# Patient Record
Sex: Female | Born: 1975 | Hispanic: Yes | Marital: Married | Smoking: Never smoker
Health system: Southern US, Community
[De-identification: ages and names within clinical notes are randomized; demographics above are authoritative.]

## PROBLEM LIST (undated history)

## (undated) ENCOUNTER — Inpatient Hospital Stay (HOSPITAL_COMMUNITY): Payer: Self-pay

## (undated) DIAGNOSIS — G43909 Migraine, unspecified, not intractable, without status migrainosus: Secondary | ICD-10-CM

## (undated) DIAGNOSIS — I10 Essential (primary) hypertension: Secondary | ICD-10-CM

## (undated) DIAGNOSIS — O10919 Unspecified pre-existing hypertension complicating pregnancy, unspecified trimester: Secondary | ICD-10-CM

## (undated) DIAGNOSIS — D649 Anemia, unspecified: Secondary | ICD-10-CM

## (undated) DIAGNOSIS — IMO0001 Reserved for inherently not codable concepts without codable children: Secondary | ICD-10-CM

## (undated) HISTORY — DX: Reserved for inherently not codable concepts without codable children: IMO0001

## (undated) HISTORY — DX: Migraine, unspecified, not intractable, without status migrainosus: G43.909

---

## 2010-09-21 LAB — STREP B DNA PROBE: GBS: NEGATIVE

## 2011-04-02 LAB — ANTIBODY SCREEN: Antibody Screen: NEGATIVE

## 2011-04-02 LAB — RPR: RPR: NONREACTIVE

## 2011-04-16 LAB — ABO/RH: RH Type: POSITIVE

## 2011-04-16 LAB — GC/CHLAMYDIA PROBE AMP, GENITAL: Gonorrhea: NEGATIVE

## 2011-08-29 ENCOUNTER — Inpatient Hospital Stay (HOSPITAL_COMMUNITY): Payer: Managed Care, Other (non HMO)

## 2011-08-29 ENCOUNTER — Encounter (HOSPITAL_COMMUNITY): Payer: Self-pay | Admitting: *Deleted

## 2011-08-29 ENCOUNTER — Inpatient Hospital Stay (HOSPITAL_COMMUNITY)
Admission: AD | Admit: 2011-08-29 | Discharge: 2011-08-29 | Disposition: A | Discharge status: Home / Self Care | Payer: Managed Care, Other (non HMO) | Source: Ambulatory Visit | Attending: Obstetrics & Gynecology | Admitting: Obstetrics & Gynecology

## 2011-08-29 DIAGNOSIS — O99891 Other specified diseases and conditions complicating pregnancy: Secondary | ICD-10-CM | POA: Insufficient documentation

## 2011-08-29 DIAGNOSIS — R030 Elevated blood-pressure reading, without diagnosis of hypertension: Secondary | ICD-10-CM | POA: Insufficient documentation

## 2011-08-29 HISTORY — DX: Anemia, unspecified: D64.9

## 2011-08-29 LAB — COMPREHENSIVE METABOLIC PANEL
ALT: 9 U/L (ref 0–35)
BUN: 6 mg/dL (ref 6–23)
CO2: 24 mEq/L (ref 19–32)
Calcium: 9.1 mg/dL (ref 8.4–10.5)
GFR calc Af Amer: >90 mL/min (ref >90)
GFR calc non Af Amer: >90 mL/min (ref >90)
Glucose, Bld: 90 mg/dL (ref 70–99)
Sodium: 133 mEq/L — ABNORMAL LOW (ref 135–145)

## 2011-08-29 LAB — CBC
HCT: 38.3 % (ref 36.0–46.0)
Hemoglobin: 12.6 g/dL (ref 12.0–15.0)
MCH: 27.9 pg (ref 26.0–34.0)
MCHC: 32.9 g/dL (ref 30.0–36.0)
MCV: 84.7 fL (ref 78.0–100.0)
RBC: 4.52 MIL/uL (ref 3.87–5.11)

## 2011-08-29 LAB — URIC ACID: Uric Acid, Serum: 3.3 mg/dL (ref 2.4–7.0)

## 2011-08-29 LAB — URINALYSIS, DIPSTICK ONLY
Glucose, UA: NEGATIVE mg/dL
Hgb urine dipstick: NEGATIVE
Nitrite: NEGATIVE

## 2011-08-29 LAB — PROTEIN / CREATININE RATIO, URINE: Total Protein, Urine: 5.1 mg/dL

## 2011-08-29 MED ORDER — LABETALOL HCL 100 MG PO TABS: 100.0000 mg | ORAL_TABLET | Freq: Three times a day (TID) | ORAL | Status: DC

## 2011-08-29 MED ORDER — LABETALOL HCL 100 MG PO TABS
100.0000 mg | ORAL_TABLET | Freq: Three times a day (TID) | ORAL | Status: DC
  Administered 2011-08-29: 100 mg via ORAL
  Filled 2011-08-29: qty 1

## 2011-08-29 MED ORDER — LABETALOL HCL 5 MG/ML IV SOLN
10.0000 mg | Freq: Once | INTRAVENOUS | Status: AC
  Administered 2011-08-29: 10 mg via INTRAVENOUS
  Filled 2011-08-29: qty 4

## 2011-08-29 MED ORDER — LACTATED RINGERS IV SOLN
INTRAVENOUS | Status: DC
  Administered 2011-08-29: 15 mL/h via INTRAVENOUS

## 2011-08-29 NOTE — H&P (Signed)
CC: htn  HPI: 36 yo G3P1102 at 29'5 being sent from the office for serial bps, PEC eval and fetal monitoring. Pt seen in office w/ no symptoms or physical signs of PEC other than elevated bp. Pt notes no HA, no vision change, no scotomata, no RUQ pain, tol reg po, good fetal movement, occ ctx, no abdominal pain, no bleeding, no LOF, good fetal movement. Pt with no prior h/o chronic htn but does have a h/o PPROM/ PEC at 28 wks as a G1.  PN Issues: - AMA, nl screening - Planning TL - h/o early PEC/ PPROM. Subsequent term delivery as G2 w/ no 17-P, no complications  Meds: PNV All: NKDA FH: mother and father w/ htn SH: primarily spanish speaking, understands limited [place].  PN Labs: DS 131, AFP neg, Hep B neg, RI, A+, HIV NR  PE:  Filed Vitals:   08/29/11 1719 08/29/11 1732 08/29/11 1748 08/29/11 1749  BP: 139/89 133/85 128/75   Pulse: 79 78 76   Temp: 98.1 F (36.7 C)     TempSrc: Oral   Oral  Resp: 20     Height:      Weight:      SpO2:        Gen: well, no distress, no pain CV: RRR Pulm: CTAB Abd: soft, gravid, NT, no RUQ pain Back: no CVAT LE: tr edema, NT, no clonus, 2+ DTR GU: def Toco: occ irritibility FH: 120's, + accels, no decels, 10 beat var  U/s: transverse, nl placenta, AFI 16, nl cvx length, growth 3'7 (65%), 4 fibroids, largest 6x5x3  CBC    Component Value Date/Time   WBC 6.9 08/29/2011 1240   RBC 4.52 08/29/2011 1240   HGB 12.6 08/29/2011 1240   HCT 38.3 08/29/2011 1240   PLT 308 08/29/2011 1240   MCV 84.7 08/29/2011 1240   MCH 27.9 08/29/2011 1240   MCHC 32.9 08/29/2011 1240   RDW 13.5 08/29/2011 1240   CMP     Component Value Date/Time   NA 133* 08/29/2011 1240   K 3.5 08/29/2011 1240   CL 100 08/29/2011 1240   CO2 24 08/29/2011 1240   GLUCOSE 90 08/29/2011 1240   BUN 6 08/29/2011 1240   CREATININE 0.49* 08/29/2011 1240   CALCIUM 9.1 08/29/2011 1240   PROT 7.2 08/29/2011 1240   ALBUMIN 2.8* 08/29/2011 1240   AST 17 08/29/2011 1240   ALT 9 08/29/2011  1240   ALKPHOS 211* 08/29/2011 1240   BILITOT 0.2* 08/29/2011 1240   GFRNONAA >90 08/29/2011 1240   GFRAA >90 08/29/2011 1240    UA 3.3 UA: neg protein  Hosp course: pt observed in MAU, elevated bp's, still w/o symptoms, intially given 100mg  po labetolol, no improvement in 1 hr, pt given 10mg  IV labetolol and improvement noted, pt cont to be observed and bp's stable.  A/P: 36 yo G3P1102 at 29'5 w/ elevated bp's and h/o early PEC in prior preg now w/ elevated bps but w/o lab or exam findings c/w PEC. Labs stable, fetal testing reassuring, no change to exam. Pt given option of o/n observation but instead asks to go home, will check bp tomorrow and report elevated bps to our after hour service. Pt also aware that any new HA, decreased FM, vaginal bleeding, ROM, increased ctx, scotomata, blurry vision, RUQ pain, N/V will need repeat eval. Will start on 100mg  po labetalol tid and f/u in office on Mon. Pt agrees.  - Reactive fetal testing  Avry Monteleone A. 08/29/2011 6:24  PM

## 2011-08-29 NOTE — Progress Notes (Signed)
Feel good, little bit of headache. Sent from office for eval.

## 2011-09-05 ENCOUNTER — Inpatient Hospital Stay (HOSPITAL_COMMUNITY)
Admission: AD | Admit: 2011-09-05 | Discharge: 2011-09-06 | Disposition: A | Discharge status: Home / Self Care | Payer: Managed Care, Other (non HMO) | Source: Ambulatory Visit | Attending: Obstetrics and Gynecology | Admitting: Obstetrics and Gynecology

## 2011-09-05 ENCOUNTER — Encounter (HOSPITAL_COMMUNITY): Payer: Self-pay | Admitting: *Deleted

## 2011-09-05 DIAGNOSIS — O4700 False labor before 37 completed weeks of gestation, unspecified trimester: Secondary | ICD-10-CM | POA: Insufficient documentation

## 2011-09-05 DIAGNOSIS — O10019 Pre-existing essential hypertension complicating pregnancy, unspecified trimester: Secondary | ICD-10-CM | POA: Insufficient documentation

## 2011-09-05 HISTORY — DX: Essential (primary) hypertension: I10

## 2011-09-05 LAB — CBC
Hemoglobin: 11.5 g/dL — ABNORMAL LOW (ref 12.0–15.0)
MCH: 28.2 pg (ref 26.0–34.0)
Platelets: 278 10*3/uL (ref 150–400)
RBC: 4.08 MIL/uL (ref 3.87–5.11)
WBC: 8.2 10*3/uL (ref 4.0–10.5)

## 2011-09-05 NOTE — Progress Notes (Signed)
G3P2 at 30.5wks. Ctxs all day. Earlier about 1 ctx every 3 hrs. But now a contraction about every . Has a h/a and states B/P has been elevated some last few wks.

## 2011-09-05 NOTE — ED Notes (Signed)
Dr cousins paged

## 2011-09-05 NOTE — Progress Notes (Signed)
Dr Cherly Hensen called unit. Aware of pt's admission and status. Aware of ctx pattern, elevated B/Ps, hx of preeclampsia, efm strip. Orders received and will see pt.

## 2011-09-05 NOTE — ED Notes (Signed)
FFN collected and pt tol well.  

## 2011-09-06 LAB — COMPREHENSIVE METABOLIC PANEL
ALT: 10 U/L (ref 0–35)
AST: 18 U/L (ref 0–37)
Alkaline Phosphatase: 208 U/L — ABNORMAL HIGH (ref 39–117)
CO2: 25 mEq/L (ref 19–32)
Chloride: 101 mEq/L (ref 96–112)
GFR calc Af Amer: >90 mL/min (ref >90)
GFR calc non Af Amer: >90 mL/min (ref >90)
Glucose, Bld: 98 mg/dL (ref 70–99)
Potassium: 3.6 mEq/L (ref 3.5–5.1)
Sodium: 133 mEq/L — ABNORMAL LOW (ref 135–145)
Total Bilirubin: 0.1 mg/dL — ABNORMAL LOW (ref 0.3–1.2)

## 2011-09-06 NOTE — Progress Notes (Signed)
Written and verbal d/c instructions given and understanding voiced. 

## 2011-09-06 NOTE — Discharge Instructions (Signed)
Sport and exercise psychologist  (Preterm Labor) El parto prematuro comienza antes de la semana 37 de [place]. La duracin de un embarazo normal es de 39 a 41 semanas.  CAUSAS  Generalmente no hay una causa que pueda identificarse. Sin embargo, una de las causas conocidas ms frecuentes son las infecciones. Las infecciones del tero, el cuello, la vagina, el lquido Golf, la vejiga, los riones y Teacher, adult education de los pulmones (neumona) pueden hacer que el trabajo de parto se inicie. Otras causas son:   Infecciones urogenitales, como infecciones por hongos y vaginosis bacteriana.   Anormalidades uterinas (forma del tero, sptum uterino, fibromas, hemorragias en la placenta).   Un cuello que ha sido operado y se abre prematuramente.   Malformaciones del beb.   Gestaciones mltiples (mellizos, trillizos y ms).   Ruptura del saco amnitico.  :Otros factores de riesgo del parto prematuro son   Historia previa de Sport and exercise psychologist.   Ruptura prematura de las [place].   La placenta cubre la apertura del cuello (placenta previa).   La placenta se separa del tero (abrupcin placentaria).   El cuello es demasiado dbil para contener al beb en el tero (cuello incompetente).   Hay mucho lquido en el saco amnitico (polihidramnios).   Consumo de drogas o hbito de fumar durante Firefighter.   No aumentar de peso lo suficiente durante el [employer].   Mujeres menores de 18 aos o [address] de [address] [place].   Nivel socioeconmico bajo.   Raza afroamericana.  SNTOMAS  Los signos y sntomas son:   Clicos del tipo menstrual   Contracciones con un intervalo entre 30 y 70 segundos, comienzan a ser regulares, se hacen ms frecuentes y se hacen ms intensas y dolorosas.   Contracciones que comienzan en la parte superior del tero y se expanden hacia abajo, hacia la zona inferior del abdomen y la espalda.   Sensacin de presin en la pelvis o dolor en la espalda.   Aparece una secrecin acuosa o  sanguinolenta por la vagina.  DIAGNSTICO  El diagnstico puede confirmarse:   Con un examen vaginal.   Ecografa del cuello.   Muestra (hisopado) de las secreciones crvico-vaginales. Estas muestras se analizan para buscar la presencia de fibronectina fetal. Esta protena que se encuentra en las secreciones del tero y se asocia con el parto prematuro.   Monitoreo fetal  TRATAMIENTO  Segn el tiempo del Psychiatrist y otras [place], el mdico puede indicar reposo en cama. Si es necesario, le indicarn medicamentos para TEFL teacher las contracciones y apurar la maduracin de los pulmones del feto. Si el trabajo de parto se inicia antes de las 34 semanas de [place], se recomienda la hospitalizacin. El tratamiento depende de las condiciones en que se encuentre la madre y el beb.  PREVENCIN  Hay algunas cosas que [employer] puede hacer para disminuir el riesgo de trabajo de parto prematuro en futuros [employer]. Una mam puede:   Dejar de fumar.   Mantener un peso saludable y evitar sustancias qumicas y drogas innecesarias.   Controlar todo tipo de infeccin.   Informar al mdico si tiene una historia conocida de parto prematuro.  Document Released: 10/07/2007 Document Revised: 03/12/2011 [facility] Patient Information 2012 [place], [place].Preeclampsia y eclampsia (Preeclampsia and Eclampsia) La preeclampsia es un trastorno por el cual hay un aumento de la presin arterial durante el embarazo. Ocurre despus de la 20a. semana de gestacin. Si se produce durante la segunda mitad del [place], y no hay otros sntomas, se denomina hipertensin gestacional y  desaparece luego que el beb nace. Si junto a la hipertensin gestacional se desarrolla alguno de los sntomas que se enumeran ms abajo, el trastorno se denomina preeclampsia. La eclampsia (convulsiones) puede seguir a la preeclampsia. sta es una de las razones por las que deben realizarse controles prenatales. Es muy importante realizar un  diagnstico y un tratamiento precoz para la prevencin. CAUSAS No existe una causa conocida para este problema. Existen algunos problemas que pueden poner a la mujer embarazada en riesgo de sufrirlo. Ellos son:  Engineer, agricultural.   Haber sufrido preeclampsia en embarazos anteriores.   Sufrir hipertensin crnica.   Si se trata de un embarazo mltiple (mellizos, trillizos).   Tener 35 aos o ms.   Pertenecer a la Public affairs consultant.   Sufrir problemas renales o ser diabtica.   Sufrir enfermedades como lupus o problemas sanguneos.   Tener sobrepeso (ser Maryjane Hurter).  SNTOMAS  Presin arterial elevada.   Dolor de cabeza   Aumento de peso sbito.   Hinchazn de manos, rostro, piernas y pies   Protenas en la orina.   Nuseas y vmitos   Problemas visuales (visin doble o borrosa).   Adormecimiento del rostro, brazos, piernas y pies.   Mareos.   Habla arrastrando las palabras.   La preeclampsia puede causar un retraso en la maduracin del feto.   Separacin (abrupcin) de la placenta.   No hay suficiente lquido en el saco amnitico (oligohidramnios).   Sensibilidad a la luz brillante.   Dolor abdominal.  DIAGNSTICO Si se descubren protenas en la orina durante la segunda mitad del [place], esto se considera preeclampsia. Tambin puede haber otros de los sntomas ya mencionados. TRATAMIENTO Es necesario Pensions consultant.   El profesional que la asiste podr prescribirle reposo en cama en las primeras etapas de la enfermedad. Mucho reposo y restriccin de sal puede ser todo lo que necesite.   Si no responde a los tratamientos ms conservadores, podr ser necesaria la administracin de medicamentos.   En los casos graves, podr ser necesaria la hospitalizacin.   Para tratar la hipertensin.   Para controlar la retencin de lquidos.   Para verificar que el beb no sufra ningn dao.   La hospitalizacin es el mejor modo de tratar los primeros  signos de preeclampsia. [employer], la mam y el beb pueden ser observados cuidadosamente y los anlisis de sangre se realizarn de manera ms efectiva y precisa.   Si el trastorno se hace ms grave, podr ser necesario inducir el parto o practicar una cesrea (extraccin del beb por medios quirrgicos). El mejor tratamiento para la preeclampsia/eclampsia es el [place].  La preeclampsia y la eclampsia implican riesgos para la madre y el beb. El profesional lo comentar con usted.. Juntos pueden elaborar la mejor manera de abordar sus problemas.  INSTRUCCIONES PARA EL CUIDADO DOMICILIARIO  Cumpla con las citas y los anlisis prenatales tal como se le indic.   Consulte con el mdico si tiene alguno de [employer].   Descanse y duerma lo suficiente.   Consuma una dieta balanceada, baja en sal, y no agregue sal a la comida.   Evite las situaciones estresantes.   Slo tome medicamentos de venta libre o de prescripcin para Chief Technology Officer, Environmental health practitioner o la fiebre, segn le haya indicado el mdico.  SOLICITE ATENCIN MDICA DE INMEDIATO SI:  Observa que se hincha alguna zona del cuerpo, generalmente las piernas.   Aumenta 5 libras (2,3 Kg) o ms en una semana.  Presenta una cefalea intensa, mareos, problemas visuales o confusin.   Tiene dolor en el abdomen, nuseas o vmitos.   Sufre convulsiones.   Tiene dificultad para mover cualquier parte del cuerpo, siente adormecimiento o tiene dificultad para hablar.   Observa un hematoma o una hemorragia anormal.   Aparece rigidez en el cuello.   Vomita.  ASEGRESE QUE:  Comprende estas instrucciones.   Controlar su enfermedad.   Solicitar ayuda inmediatamente si no mejora o si empeora.  Document Released: 04/09/2005 Document Revised: 03/12/2011 [facility] Patient Information 2012 [place], [place].

## 2011-09-06 NOTE — ED Notes (Signed)
Dr. Cousins in to see pt.

## 2011-09-06 NOTE — ED Notes (Signed)
Lab results called to Dr Cherly Hensen. Pt stable for d/c home.

## 2011-09-06 NOTE — ED Provider Notes (Signed)
History   36 yo G3P1102 Hispanic female now @ 30 4/7 week presents for evaluation of ctxs q 30-45 mins  All day. Per pt ctx have gotten closer. (+) FM  No vaginal bleeding. PNC complicated by elev BP for which pt was started on normadyne. Per pt, has had some visual spots. Per husband h/a all the time  Chief Complaint  Patient presents with  . Contractions     OB History    Grav Para Term Preterm Abortions TAB SAB Ect Mult Living   3 2 2       2       Past Medical History  Diagnosis Date  . Anemia   . Hypertension     Past Surgical History  Procedure Date  . No past surgeries   . Cesarean section     Family History  Problem Relation Age of Onset  . Anesthesia problems Neg Hx   . Hypotension Neg Hx   . Malignant hyperthermia Neg Hx   . Pseudochol deficiency Neg Hx     History  Substance Use Topics  . Smoking status: Never Smoker   . Smokeless tobacco: Never Used  . Alcohol Use: No    Allergies: No Known Allergies  Prescriptions prior to admission  Medication Sig Dispense Refill  . acetaminophen (TYLENOL) 500 MG tablet Take 1,000 mg by mouth every 6 (six) hours as needed. Takes for pain      . labetalol (NORMODYNE) 100 MG tablet Take 100-200 mg by mouth 3 (three) times daily. Take 200 mg in the morning, 200 mg in the evening, and 100 mg at night.      . Prenatal Vit-Fe Fumarate-FA (PRENATAL MULTIVITAMIN) TABS Take 1 tablet by mouth daily.         Physical Exam   Blood pressure 144/96, pulse 70, temperature 98.8 F (37.1 C), temperature source Oral, resp. rate 20, height 5\' 2"  (1.575 m), weight 157 lb 12.8 oz (71.578 kg).  General appearance: alert, cooperative and no distress Lungs: clear to auscultation bilaterally Heart: regular rate and rhythm, S1, S2 normal, no murmur, click, rub or gallop Abdomen: soft, non-tender; bowel sounds normal; no masses,  no organomegaly Pelvic: cervix normal in appearance and long/closed/firm ballottable  Extremities: no  edema, redness or tenderness in the calves or thighs  Tracing reviewed. irreg ctx baseline 140 small accels.  ED Course  PMC Chronic HTN r/o superimposed preeclampsia Hx PTB IUP @ 30 4/7  P) FFN. PIH labs. If nl results, d/c home with PTL prec, preeclampsia warning signs MDM  Anwyn Kriegel A, MD 12:41 AM 09/06/2011

## 2011-09-26 ENCOUNTER — Inpatient Hospital Stay (HOSPITAL_COMMUNITY)
Admission: AD | Admit: 2011-09-26 | Discharge: 2011-09-30 | DRG: 765 | Disposition: A | Discharge status: Home / Self Care | Payer: Managed Care, Other (non HMO) | Source: Ambulatory Visit | Attending: Obstetrics | Admitting: Obstetrics

## 2011-09-26 ENCOUNTER — Inpatient Hospital Stay (HOSPITAL_COMMUNITY): Payer: Managed Care, Other (non HMO)

## 2011-09-26 ENCOUNTER — Encounter (HOSPITAL_COMMUNITY): Payer: Self-pay | Admitting: *Deleted

## 2011-09-26 DIAGNOSIS — O1002 Pre-existing essential hypertension complicating childbirth: Principal | ICD-10-CM | POA: Diagnosis present

## 2011-09-26 DIAGNOSIS — O34599 Maternal care for other abnormalities of gravid uterus, unspecified trimester: Secondary | ICD-10-CM | POA: Diagnosis present

## 2011-09-26 DIAGNOSIS — D4959 Neoplasm of unspecified behavior of other genitourinary organ: Secondary | ICD-10-CM | POA: Diagnosis present

## 2011-09-26 DIAGNOSIS — D259 Leiomyoma of uterus, unspecified: Secondary | ICD-10-CM | POA: Diagnosis present

## 2011-09-26 DIAGNOSIS — O365990 Maternal care for other known or suspected poor fetal growth, unspecified trimester, not applicable or unspecified: Secondary | ICD-10-CM | POA: Diagnosis present

## 2011-09-26 DIAGNOSIS — I10 Essential (primary) hypertension: Secondary | ICD-10-CM | POA: Diagnosis present

## 2011-09-26 HISTORY — DX: Unspecified pre-existing hypertension complicating pregnancy, unspecified trimester: O10.919

## 2011-09-26 LAB — COMPREHENSIVE METABOLIC PANEL
Albumin: 2.8 g/dL — ABNORMAL LOW (ref 3.5–5.2)
Alkaline Phosphatase: 268 U/L — ABNORMAL HIGH (ref 39–117)
BUN: 8 mg/dL (ref 6–23)
CO2: 21 mEq/L (ref 19–32)
Chloride: 101 mEq/L (ref 96–112)
Glucose, Bld: 119 mg/dL — ABNORMAL HIGH (ref 70–99)
Potassium: 3.7 mEq/L (ref 3.5–5.1)
Total Bilirubin: 0.1 mg/dL — ABNORMAL LOW (ref 0.3–1.2)

## 2011-09-26 LAB — TYPE AND SCREEN
ABO/RH(D): A POS
Antibody Screen: NEGATIVE

## 2011-09-26 LAB — ABO/RH: ABO/RH(D): A POS

## 2011-09-26 LAB — URIC ACID: Uric Acid, Serum: 4.3 mg/dL (ref 2.4–7.0)

## 2011-09-26 LAB — RPR: RPR: NONREACTIVE

## 2011-09-26 LAB — CBC
Hemoglobin: 12.3 g/dL (ref 12.0–15.0)
RBC: 4.37 MIL/uL (ref 3.87–5.11)

## 2011-09-26 MED ORDER — LABETALOL HCL 300 MG PO TABS
300.0000 mg | ORAL_TABLET | Freq: Three times a day (TID) | ORAL | Status: DC
  Administered 2011-09-26: 300 mg via ORAL
  Filled 2011-09-26 (×3): qty 1

## 2011-09-26 MED ORDER — ACETAMINOPHEN 325 MG PO TABS: 650.0000 mg | ORAL_TABLET | ORAL | Status: DC | PRN

## 2011-09-26 MED ORDER — CALCIUM CARBONATE ANTACID 500 MG PO CHEW: 2.0000 | CHEWABLE_TABLET | ORAL | Status: DC | PRN

## 2011-09-26 MED ORDER — LABETALOL HCL 5 MG/ML IV SOLN
10.0000 mg | Freq: Once | INTRAVENOUS | Status: AC
  Administered 2011-09-26: 10 mg via INTRAVENOUS
  Filled 2011-09-26: qty 4

## 2011-09-26 MED ORDER — ZOLPIDEM TARTRATE 10 MG PO TABS: 10.0000 mg | ORAL_TABLET | Freq: Every evening | ORAL | Status: DC | PRN

## 2011-09-26 MED ORDER — SODIUM CHLORIDE 0.9 % IJ SOLN: 3.0000 mL | Freq: Two times a day (BID) | INTRAMUSCULAR | Status: DC

## 2011-09-26 MED ORDER — ACETAMINOPHEN 500 MG PO TABS
1000.0000 mg | ORAL_TABLET | ORAL | Status: DC | PRN
  Administered 2011-09-26: 1000 mg via ORAL
  Filled 2011-09-26: qty 2

## 2011-09-26 MED ORDER — DOCUSATE SODIUM 100 MG PO CAPS
100.0000 mg | ORAL_CAPSULE | Freq: Every day | ORAL | Status: DC
  Administered 2011-09-26: 100 mg via ORAL
  Filled 2011-09-26: qty 1

## 2011-09-26 MED ORDER — SODIUM CHLORIDE 0.9 % IJ SOLN: 3.0000 mL | INTRAMUSCULAR | Status: DC | PRN

## 2011-09-26 MED ORDER — BETAMETHASONE SOD PHOS & ACET 6 (3-3) MG/ML IJ SUSP
12.0000 mg | INTRAMUSCULAR | Status: DC
  Administered 2011-09-26: 12 mg via INTRAMUSCULAR
  Filled 2011-09-26: qty 2

## 2011-09-26 MED ORDER — HYDRALAZINE HCL 20 MG/ML IJ SOLN
10.0000 mg | Freq: Once | INTRAMUSCULAR | Status: AC
  Administered 2011-09-26: 10 mg via INTRAVENOUS

## 2011-09-26 MED ORDER — PRENATAL MULTIVITAMIN CH: 1.0000 | ORAL_TABLET | Freq: Every day | ORAL | Status: DC

## 2011-09-26 MED ORDER — HYDRALAZINE HCL 20 MG/ML IJ SOLN
INTRAMUSCULAR | Status: AC
  Administered 2011-09-26: 10 mg via INTRAVENOUS
  Filled 2011-09-26: qty 1

## 2011-09-26 MED ORDER — SODIUM CHLORIDE 0.9 % IV SOLN: 250.0000 mL | INTRAVENOUS | Status: DC | PRN

## 2011-09-26 NOTE — H&P (Signed)
CC: gestational hypertension and reversed end diastolic flow through umbilical artery  HPI: 36 yo Spanish speaking G3P1102 at 33'5 presents from office for further evaluation due to exacerbation of hypertension and finding of reversed EDF on OB u/s today. Pt states occasional HA, usually relieved by Tylenol, no scotomata. No RUQ pain, no complaints of swelling. Normal appetite, no emesis. Pt notes no contractions, no LOF, no VB but does not baby moving less than normal  PN Issues: Dated by LMP c/w 12 wk u/s  - Hypertension.  Pt denies h/o chronic htn but was noted to have mildly elevated bps through the first and second trimester. Acute elevations of bp at 29 wks (160's/ 110) and extended observation in MAU at that time. Pt had improvement in bp's with labetolol but more recently has needed continued dose adjustments. 24 hr urine 2/18 showed 150 mg protein. PIH labs have remained normal (most recently 2/24)  - Fetal testing. Due to htn, fetal growth u/s done 3/15 noted growth 1850g/ 4'1/ 25% with AFI 9.5 (13%). AC 27.3cm (4%). BPP 8/8 but reversed EDF noted.   - desires VBAC. H/o 28 wk urgent c/s in [place] followed by 37 wk VBAC in [place]  - fibroids. Anterior uterine wall 5 x 5 x 5 cm  - desires TL  PMH: 28 wk urgent c/s  FH: chronic htn SH: married, no toxic habits, limited social support, spanish speaker with language barrier  POBHx: - G1, in [place], 28 wks urgent c/s. Pt notes htn/ PEC then PROM then fetal distress. Unable to obtain clear picture of events - G2, uncomplicated VBAC at 37 wks, [place] (no 17-P)  NKDA Meds: PNV, Tylenol, labetalol 300mg  tid  PN labs: A+, ab neg, HIV neg, Hep B neg, RPR NR, RI, nl NT, nl AFP, 1 hr GTT 131  PE Filed Vitals:   09/26/11 2036 09/26/11 2050 09/26/11 2104 09/26/11 2105  BP: 156/64 170/111 171/113 173/106  Pulse: 81 81 73 88  Temp:      TempSrc:      Resp:      Height:      Weight:       Gen: well appearing, no distress CV:  RRR Pulm: CTAB Abd: gravid, NT, no RUQ pain LE: 1+ edema, 3+ DTR, no clonus GU: def Toco: none FH: 140's, + accels, no decels, 10 beat variability  A/P: 36 yo G3P1102 at 33'5 w/ doppler findings of reversed end diastolic flow in the setting of increasing bps. - elevated bps. Unclear if pt has a history of chronic htn (borderline bp's in 1st/2nd trimester) and this is an exacerbation of acute on chronic htn or if this is a manifestation of PEC. Pt has no other lab or clinical findings of PEC.  The UA findings can be c/w either htn related placental insufficiency or PEC. If pt was determined to have PEC would move towards delivery, possibly before the completion of BMZ as pt is so close to 24 wks. At present time, pt is stable and fetal strip is reactive. Would also consider moving toward delivery if unable to control bps. Dr. Sherrie George consulted about pt's care who agrees with either immediate delivery for unstable bps in setting of reversed EDF or close monitoring with delivery upon completion of BMZ.  - Early GA, BMZ started. - FWB. Concern given doppler findings, will repeat now. BPP 8/8 and NST reactive.  - MOD. Pt intially planning TOLAC though would plan RCS if moving for delivery due to abnormal  UA dopplers.   Glynn Yepes A. 09/26/2011 9:21 PM

## 2011-09-27 ENCOUNTER — Encounter (HOSPITAL_COMMUNITY): Payer: Self-pay | Admitting: Anesthesiology

## 2011-09-27 ENCOUNTER — Encounter (HOSPITAL_COMMUNITY): Payer: Self-pay | Admitting: *Deleted

## 2011-09-27 ENCOUNTER — Inpatient Hospital Stay (HOSPITAL_COMMUNITY): Payer: Managed Care, Other (non HMO) | Admitting: Anesthesiology

## 2011-09-27 ENCOUNTER — Encounter (HOSPITAL_COMMUNITY)
Admission: AD | Disposition: A | Discharge status: Home / Self Care | Payer: Self-pay | Source: Ambulatory Visit | Attending: Obstetrics

## 2011-09-27 DIAGNOSIS — O10919 Unspecified pre-existing hypertension complicating pregnancy, unspecified trimester: Secondary | ICD-10-CM

## 2011-09-27 DIAGNOSIS — I10 Essential (primary) hypertension: Secondary | ICD-10-CM | POA: Diagnosis present

## 2011-09-27 HISTORY — DX: Unspecified pre-existing hypertension complicating pregnancy, unspecified trimester: O10.919

## 2011-09-27 LAB — CBC
HCT: 37.1 % (ref 36.0–46.0)
Hemoglobin: 12.3 g/dL (ref 12.0–15.0)
MCH: 28.1 pg (ref 26.0–34.0)
MCHC: 33.4 g/dL (ref 30.0–36.0)
MCV: 84.2 fL (ref 78.0–100.0)
RBC: 4.37 MIL/uL (ref 3.87–5.11)
RDW: 14.2 % (ref 11.5–15.5)

## 2011-09-27 LAB — COMPREHENSIVE METABOLIC PANEL
ALT: 15 U/L (ref 0–35)
AST: 24 U/L (ref 0–37)
CO2: 23 mEq/L (ref 19–32)
Calcium: 9.4 mg/dL (ref 8.4–10.5)
GFR calc non Af Amer: >90 mL/min (ref >90)
Sodium: 134 mEq/L — ABNORMAL LOW (ref 135–145)
Total Protein: 6.3 g/dL (ref 6.0–8.3)

## 2011-09-27 LAB — PROTEIN / CREATININE RATIO, URINE
Creatinine, Urine: 41.02 mg/dL
Total Protein, Urine: 4.2 mg/dL

## 2011-09-27 SURGERY — Surgical Case
Anesthesia: Regional | Site: Abdomen | Wound class: Clean Contaminated

## 2011-09-27 MED ORDER — ZOLPIDEM TARTRATE 5 MG PO TABS: 5.0000 mg | ORAL_TABLET | Freq: Every evening | ORAL | Status: DC | PRN

## 2011-09-27 MED ORDER — ONDANSETRON HCL 4 MG PO TABS: 4.0000 mg | ORAL_TABLET | ORAL | Status: DC | PRN

## 2011-09-27 MED ORDER — NALOXONE HCL 0.4 MG/ML IJ SOLN: 0.4000 mg | INTRAMUSCULAR | Status: DC | PRN

## 2011-09-27 MED ORDER — SCOPOLAMINE 1 MG/3DAYS TD PT72
MEDICATED_PATCH | TRANSDERMAL | Status: AC
  Filled 2011-09-27: qty 1

## 2011-09-27 MED ORDER — DIPHENHYDRAMINE HCL 50 MG/ML IJ SOLN: 25.0000 mg | INTRAMUSCULAR | Status: DC | PRN

## 2011-09-27 MED ORDER — OXYTOCIN 10 UNIT/ML IJ SOLN
INTRAMUSCULAR | Status: AC
  Filled 2011-09-27: qty 2

## 2011-09-27 MED ORDER — DIPHENHYDRAMINE HCL 25 MG PO CAPS
25.0000 mg | ORAL_CAPSULE | Freq: Four times a day (QID) | ORAL | Status: DC | PRN
  Filled 2011-09-27: qty 1

## 2011-09-27 MED ORDER — OXYCODONE-ACETAMINOPHEN 5-325 MG PO TABS
1.0000 | ORAL_TABLET | ORAL | Status: DC | PRN
  Administered 2011-09-28: 1 via ORAL
  Administered 2011-09-28: 2 via ORAL
  Filled 2011-09-27: qty 1
  Filled 2011-09-27: qty 2

## 2011-09-27 MED ORDER — MORPHINE SULFATE 0.5 MG/ML IJ SOLN
INTRAMUSCULAR | Status: AC
  Filled 2011-09-27: qty 10

## 2011-09-27 MED ORDER — SIMETHICONE 80 MG PO CHEW
80.0000 mg | CHEWABLE_TABLET | ORAL | Status: DC | PRN
  Administered 2011-09-29: 80 mg via ORAL

## 2011-09-27 MED ORDER — SENNOSIDES-DOCUSATE SODIUM 8.6-50 MG PO TABS
2.0000 | ORAL_TABLET | Freq: Every day | ORAL | Status: DC
  Administered 2011-09-27 – 2011-09-29 (×3): 2 via ORAL

## 2011-09-27 MED ORDER — FENTANYL CITRATE 0.05 MG/ML IJ SOLN
INTRAMUSCULAR | Status: DC | PRN
  Administered 2011-09-27 (×2): 25 ug via INTRAVENOUS
  Administered 2011-09-27: 25 ug via INTRATHECAL
  Administered 2011-09-27: 25 ug via INTRAVENOUS

## 2011-09-27 MED ORDER — ONDANSETRON HCL 4 MG/2ML IJ SOLN
INTRAMUSCULAR | Status: AC
  Filled 2011-09-27: qty 2

## 2011-09-27 MED ORDER — DIPHENHYDRAMINE HCL 25 MG PO CAPS
25.0000 mg | ORAL_CAPSULE | ORAL | Status: DC | PRN
  Administered 2011-09-27: 25 mg via ORAL

## 2011-09-27 MED ORDER — PRENATAL MULTIVITAMIN CH
1.0000 | ORAL_TABLET | Freq: Every day | ORAL | Status: DC
  Administered 2011-09-27 – 2011-09-29 (×3): 1 via ORAL
  Filled 2011-09-27 (×4): qty 1

## 2011-09-27 MED ORDER — CEFAZOLIN SODIUM 1-5 GM-% IV SOLN
INTRAVENOUS | Status: DC | PRN
  Administered 2011-09-27: 1 g via INTRAVENOUS

## 2011-09-27 MED ORDER — IBUPROFEN 600 MG PO TABS
600.0000 mg | ORAL_TABLET | Freq: Four times a day (QID) | ORAL | Status: DC
  Administered 2011-09-27 – 2011-09-30 (×11): 600 mg via ORAL
  Filled 2011-09-27 (×2): qty 1

## 2011-09-27 MED ORDER — CEFAZOLIN SODIUM 1-5 GM-% IV SOLN
INTRAVENOUS | Status: AC
  Filled 2011-09-27: qty 50

## 2011-09-27 MED ORDER — OXYTOCIN 20 UNITS IN LACTATED RINGERS INFUSION - SIMPLE
INTRAVENOUS | Status: DC | PRN
  Administered 2011-09-27 (×2): 20 [IU] via INTRAVENOUS

## 2011-09-27 MED ORDER — SODIUM CHLORIDE 0.9 % IJ SOLN: 3.0000 mL | INTRAMUSCULAR | Status: DC | PRN

## 2011-09-27 MED ORDER — OXYTOCIN 20 UNITS IN LACTATED RINGERS INFUSION - SIMPLE
125.0000 mL/h | INTRAVENOUS | Status: AC
  Filled 2011-09-27 (×2): qty 1000

## 2011-09-27 MED ORDER — WITCH HAZEL-GLYCERIN EX PADS: 1.0000 "application " | MEDICATED_PAD | CUTANEOUS | Status: DC | PRN

## 2011-09-27 MED ORDER — NALBUPHINE HCL 10 MG/ML IJ SOLN
5.0000 mg | INTRAMUSCULAR | Status: DC | PRN
  Filled 2011-09-27: qty 1

## 2011-09-27 MED ORDER — NALOXONE HCL 0.4 MG/ML IJ SOLN
1.0000 ug/kg/h | INTRAMUSCULAR | Status: DC | PRN
  Filled 2011-09-27: qty 2.5

## 2011-09-27 MED ORDER — ACETAMINOPHEN 500 MG PO TABS: 1000.0000 mg | ORAL_TABLET | Freq: Once | ORAL | Status: DC

## 2011-09-27 MED ORDER — KETOROLAC TROMETHAMINE 60 MG/2ML IM SOLN
INTRAMUSCULAR | Status: AC
  Filled 2011-09-27: qty 2

## 2011-09-27 MED ORDER — ONDANSETRON HCL 4 MG/2ML IJ SOLN: 4.0000 mg | Freq: Three times a day (TID) | INTRAMUSCULAR | Status: DC | PRN

## 2011-09-27 MED ORDER — LANOLIN HYDROUS EX OINT: 1.0000 "application " | TOPICAL_OINTMENT | CUTANEOUS | Status: DC | PRN

## 2011-09-27 MED ORDER — LACTATED RINGERS IV SOLN
INTRAVENOUS | Status: DC
  Administered 2011-09-27: 10:00:00 via INTRAVENOUS

## 2011-09-27 MED ORDER — HYDRALAZINE HCL 10 MG PO TABS
5.0000 mg | ORAL_TABLET | Freq: Four times a day (QID) | ORAL | Status: DC
  Administered 2011-09-27 – 2011-09-29 (×9): 5 mg via ORAL
  Administered 2011-09-29: 10 mg via ORAL
  Administered 2011-09-29: 5 mg via ORAL
  Administered 2011-09-29: 11:00:00 via ORAL
  Administered 2011-09-29: 5 mg via ORAL
  Administered 2011-09-30: 10 mg via ORAL
  Administered 2011-09-30: 5 mg via ORAL
  Filled 2011-09-27 (×22): qty 1

## 2011-09-27 MED ORDER — SCOPOLAMINE 1 MG/3DAYS TD PT72
1.0000 | MEDICATED_PATCH | Freq: Once | TRANSDERMAL | Status: AC
  Administered 2011-09-27: 1.5 mg via TRANSDERMAL

## 2011-09-27 MED ORDER — PHENYLEPHRINE 40 MCG/ML (10ML) SYRINGE FOR IV PUSH (FOR BLOOD PRESSURE SUPPORT)
PREFILLED_SYRINGE | INTRAVENOUS | Status: AC
  Filled 2011-09-27: qty 5

## 2011-09-27 MED ORDER — ONDANSETRON HCL 4 MG/2ML IJ SOLN
INTRAMUSCULAR | Status: DC | PRN
  Administered 2011-09-27: 4 mg via INTRAVENOUS

## 2011-09-27 MED ORDER — MENTHOL 3 MG MT LOZG: 1.0000 | LOZENGE | OROMUCOSAL | Status: DC | PRN

## 2011-09-27 MED ORDER — DIPHENHYDRAMINE HCL 50 MG/ML IJ SOLN
12.5000 mg | INTRAMUSCULAR | Status: DC | PRN
Start: 2011-09-27 — End: 2011-09-30

## 2011-09-27 MED ORDER — ONDANSETRON HCL 4 MG/2ML IJ SOLN: 4.0000 mg | INTRAMUSCULAR | Status: DC | PRN

## 2011-09-27 MED ORDER — TETANUS-DIPHTH-ACELL PERTUSSIS 5-2.5-18.5 LF-MCG/0.5 IM SUSP
0.5000 mL | Freq: Once | INTRAMUSCULAR | Status: AC
  Administered 2011-09-28: 0.5 mL via INTRAMUSCULAR
  Filled 2011-09-27: qty 0.5

## 2011-09-27 MED ORDER — CITRIC ACID-SODIUM CITRATE 334-500 MG/5ML PO SOLN
ORAL | Status: AC
  Administered 2011-09-27: 30 mL
  Filled 2011-09-27: qty 15

## 2011-09-27 MED ORDER — MORPHINE SULFATE (PF) 0.5 MG/ML IJ SOLN
INTRAMUSCULAR | Status: DC | PRN
  Administered 2011-09-27: .15 mg via INTRATHECAL

## 2011-09-27 MED ORDER — KETOROLAC TROMETHAMINE 30 MG/ML IJ SOLN: 30.0000 mg | Freq: Four times a day (QID) | INTRAMUSCULAR | Status: AC | PRN

## 2011-09-27 MED ORDER — MEPERIDINE HCL 25 MG/ML IJ SOLN
6.2500 mg | INTRAMUSCULAR | Status: AC | PRN
  Administered 2011-09-27 (×2): 6.25 mg via INTRAVENOUS

## 2011-09-27 MED ORDER — SIMETHICONE 80 MG PO CHEW
80.0000 mg | CHEWABLE_TABLET | Freq: Three times a day (TID) | ORAL | Status: DC
  Administered 2011-09-27 – 2011-09-29 (×7): 80 mg via ORAL

## 2011-09-27 MED ORDER — LACTATED RINGERS IV SOLN
INTRAVENOUS | Status: DC | PRN
  Administered 2011-09-27 (×3): via INTRAVENOUS

## 2011-09-27 MED ORDER — KETOROLAC TROMETHAMINE 60 MG/2ML IM SOLN
60.0000 mg | Freq: Once | INTRAMUSCULAR | Status: AC | PRN
  Administered 2011-09-27: 60 mg via INTRAMUSCULAR

## 2011-09-27 MED ORDER — DIBUCAINE 1 % RE OINT: 1.0000 "application " | TOPICAL_OINTMENT | RECTAL | Status: DC | PRN

## 2011-09-27 MED ORDER — MEPERIDINE HCL 25 MG/ML IJ SOLN
INTRAMUSCULAR | Status: AC
  Filled 2011-09-27: qty 1

## 2011-09-27 MED ORDER — IBUPROFEN 600 MG PO TABS
600.0000 mg | ORAL_TABLET | Freq: Four times a day (QID) | ORAL | Status: DC | PRN
  Administered 2011-09-29 – 2011-09-30 (×2): 600 mg via ORAL
  Filled 2011-09-27 (×12): qty 1

## 2011-09-27 MED ORDER — PHENYLEPHRINE HCL 10 MG/ML IJ SOLN
INTRAMUSCULAR | Status: DC | PRN
  Administered 2011-09-27: 40 ug via INTRAVENOUS
  Administered 2011-09-27: 80 ug via INTRAVENOUS

## 2011-09-27 MED ORDER — HYDROMORPHONE HCL PF 1 MG/ML IJ SOLN: 0.2500 mg | INTRAMUSCULAR | Status: DC | PRN

## 2011-09-27 MED ORDER — METOCLOPRAMIDE HCL 5 MG/ML IJ SOLN: 10.0000 mg | Freq: Three times a day (TID) | INTRAMUSCULAR | Status: DC | PRN

## 2011-09-27 MED ORDER — FENTANYL CITRATE 0.05 MG/ML IJ SOLN
INTRAMUSCULAR | Status: AC
  Filled 2011-09-27: qty 2

## 2011-09-27 SURGICAL SUPPLY — 29 items
CHLORAPREP W/TINT 26ML (MISCELLANEOUS) ×2 IMPLANT
CLOTH BEACON ORANGE TIMEOUT ST (SAFETY) ×2 IMPLANT
CONTAINER PREFILL 10% NBF 15ML (MISCELLANEOUS) IMPLANT
ELECT REM PT RETURN 9FT ADLT (ELECTROSURGICAL) ×2
ELECTRODE REM PT RTRN 9FT ADLT (ELECTROSURGICAL) ×1 IMPLANT
EXTRACTOR VACUUM KIWI (MISCELLANEOUS) IMPLANT
EXTRACTOR VACUUM M CUP 4 TUBE (SUCTIONS) IMPLANT
GLOVE BIO SURGEON STRL SZ 6.5 (GLOVE) ×2 IMPLANT
GLOVE BIOGEL PI IND STRL 7.0 (GLOVE) ×2 IMPLANT
GLOVE BIOGEL PI INDICATOR 7.0 (GLOVE) ×2
GOWN PREVENTION PLUS LG XLONG (DISPOSABLE) ×6 IMPLANT
KIT ABG SYR 3ML LUER SLIP (SYRINGE) ×2 IMPLANT
NEEDLE HYPO 25X5/8 SAFETYGLIDE (NEEDLE) ×2 IMPLANT
NS IRRIG 1000ML POUR BTL (IV SOLUTION) ×2 IMPLANT
PACK C SECTION WH (CUSTOM PROCEDURE TRAY) ×2 IMPLANT
SLEEVE SCD COMPRESS KNEE MED (MISCELLANEOUS) IMPLANT
STAPLER VISISTAT 35W (STAPLE) ×2 IMPLANT
STRIP CLOSURE SKIN 1/2X4 (GAUZE/BANDAGES/DRESSINGS) IMPLANT
SUT MON AB 4-0 PS1 27 (SUTURE) IMPLANT
SUT PLAIN 0 NONE (SUTURE) IMPLANT
SUT PLAIN 2 0 XLH (SUTURE) IMPLANT
SUT VIC AB 0 CT1 36 (SUTURE) ×4 IMPLANT
SUT VIC AB 0 CTX 36 (SUTURE) ×3
SUT VIC AB 0 CTX36XBRD ANBCTRL (SUTURE) ×3 IMPLANT
SUT VIC AB 2-0 CT1 27 (SUTURE) ×1
SUT VIC AB 2-0 CT1 TAPERPNT 27 (SUTURE) ×1 IMPLANT
TOWEL OR 17X24 6PK STRL BLUE (TOWEL DISPOSABLE) ×4 IMPLANT
TRAY FOLEY CATH 14FR (SET/KITS/TRAYS/PACK) IMPLANT
WATER STERILE IRR 1000ML POUR (IV SOLUTION) IMPLANT

## 2011-09-27 NOTE — Progress Notes (Addendum)
INTERVAL NOTE:  S:  Sitting in bed, min cramping, foley catheter patent, small bleeding, reports mild headache controlled with tylenol, denies visual changes/NV/epigastric pain/ dizziness. Infant stable in NICU. Plans to breastfeed  O:   Filed Vitals:   09/27/11 0539 09/27/11 0639 09/27/11 0800 09/27/11 1000  BP:  157/65  148/85  Pulse:  67 89 58  Temp:  97.8 F (36.6 C)  97.3 F (36.3 C)  TempSrc:  Oral  Oral  Resp:  18  16  Height: 5\' 2"  (1.575 m)     Weight: 158 lb (71.668 kg)     SpO2:  98% 97%    AAO x 3, NAD FF bellow U-1 Scant rubra lochia Dressing clean/dry/intact SCD's on bilat  A / P:   POD 0 s/p LCTS at 33.[redacted] weeks EGA for exacerbation of CHTN and AEDF; no evidence of PEC; BP stable on hydralazine    Stable post partum and post operative  Routine PP orders  Strict I/O  Tylenol for H/A  Lactation assistance for breast milk expression  Juanetta Beets SNM PAUL,DANIELA, CNM, MSN  09/27/2011 10:09 AM  Pt also seen. Has been getting scheduled hydralizine. No HA, pain controlled, no scotomata, no RUQ pain. tol reg po. Ambulating well.   Vitals/ exam as above  CV- RRR, Pulm: CTAB, Abd: no ruq pain, LE: 3+ DTR, 1+ edema, no clonus   Labs noted  POD# 0 s/p urgent RCS at 33'6 for AEDF, fetal decels in setting of htn. - post op doing well - htn. Most likely acute on chronic htn w/o PEC (labs remain stable again today and no sx of PEC). No mag at this time but if new si/sx PEC would have low threshold to start mag. bp's controlled on hydralizine. - baby in NICU - start pumping/ lactation consult  Ednamae Schiano A. 09/27/2011 12:14 PM

## 2011-09-27 NOTE — Anesthesia Postprocedure Evaluation (Signed)
  Anesthesia Post-op Note  Patient: Jamie Thornton  Procedure(s) Performed: Procedure(s) (LRB): CESAREAN SECTION (N/A)  Patient is awake, responsive, moving her legs, and has signs of resolution of her numbness. Pain and nausea are reasonably well controlled. Vital signs are stable and clinically acceptable. Oxygen saturation is clinically acceptable. There are no apparent anesthetic complications at this time. Patient is ready for discharge.

## 2011-09-27 NOTE — Op Note (Signed)
Preoperative diagnosis: placental insufficiency, absent/ reversed UA EDF, non-reassuring fetal testing, maternal hypertension  Postop diagnosis:   placental insufficiency, absent/ reversed UA EDF, non-reassuring fetal testing, maternal hypertension   Procedure: RCS Surgeon: Noland Fordyce, MD Assistant:. Cousins Anesthesia: spinal Findings:  @BABYSEXEBC @ infant,  APGAR (1 MIN): 8   APGAR (5 MINS): 9    [place], cord pH 7.17, nl b/l ovaries and tubes, small calcified placenta, no clear infarct, clear amniotic fluid, multiple fibroids in uterus, 4 IM, 2 cm fibroid and L fundus, 4 cm post/ SS/R EBL: per anasthesia Antibiotics:  1g Ancef  Complications: none  Indications: This is a 36 y.o. year-old, [id number]  At [redacted]w[redacted]d admitted for worsening htn and reversed EDF on dopplers. Pt has been having elevated bp's for the past month with continued need for dose adjustments and has been increased to labetolol 300mg  tid. Pt has had several lab evaluations for PEC and testing, as well as exam findings have been negative for PEC. On testing today, fetal growth at 25% w/ AC at 4% and reversed EDF noted. Pt admitted for BMZ, bp control and fetal monitoring. Initial fetal testing reassuring but through night decreased FH variability and increased deep variables. Given severe placental insufficiency decision made to proceed to delivery.  Risks benefits and alternatives of the procedure were discussed with the patient who agreed to proceed.  Procedure:  After informed consent was obtained the patient was taken to the operating room where spinal anesthesia was initiated.  She was prepped and draped in the normal sterile fashion in dorsal supine position with a leftward tilt.  A foley catheter was inserted sterilely into the bladder prior in antepartum. Gloves were changes and attention was turned to the patient's abdomen. A Pfannenstiel skin incision was made 2 cm above the pubic symphysis in the midline with the scalpel,  over the prior incision. Dissection was carried down with the Bovie cautery until the fascia was reached. The fascia was incised in the midline. The incision was extended laterally with the Mayo scissors. The inferior aspect of the fascial incision was grasped with the Coker clamps, elevated up and the underlying rectus muscles were dissected off sharply. The superior aspect of the fascial incision was grasped with the Coker clamps elevated up and the underlying rectus muscles were dissected off sharply.  The peritoneum was dissected sharply and entered bluntly. The peritoneal incision was extended superiorly and inferiorly with good visualization of the bladder. The bladder blade was inserted, the vesicouterine peritoneum was identified grasped with the pickups and entered sharply. The bladder flap was created digitally the bladder blade was reinserted. Palpation was done to assess the fetal position and the location of the uterine vessels. The lower segment of the uterus was incised sharply with the scalpel and extended superiorly and laterally with the bandage scissors. The infant also was grasped brought to the incision,  rotated and the infant was delivered with fundal pressure. The nose and mouth were bulb suctioned. The cord was clamped and cut. Cord pH and blood were collected. The infant was handed off to the waiting pediatrician. The placenta was expressed. The uterus was left in situ The uterus was cleared of all clots and debris. The uterine incision was repaired with 0 Vicryl in a running locked fashion.  A second layer of the same suture was used in an imbricating fashion to obtain excellent hemostasis. Tubes and ovaries were noted to be normal. The gutters were cleared of all clots and debris. The uterine  incision was reinspected and found to be hemostatic. The peritoneum was grasped and closed with 2-0 Vicryl in a running fashion. The cut muscle edges and the underside of the fascia were inspected  and found to be hemostatic. The fascia was closed with 0 Vicryl in two halves. The subcutaneous tissue was irrigated.  The skin was closed with staples. The patient tolerated the procedure well. Sponge lap and needle counts were correct x3 and patient was taken to the recovery room in a stable condition.  Abrahim Sargent A. 09/27/2011 2:26 AM

## 2011-09-27 NOTE — Progress Notes (Signed)
CTSP for fetal strip  Course of events: persistant high bps after 10mg  IV labetolol. Pt w/ HA. Given 10mg  IV Hydralizine around 9:30p and subsequent improvement in HA and bp.  Pt had repeat u/s at that time w/ UA dopplers absent not reversed and fetal movement and breathing notes during eval of dopplers.  Since that time decrease in variability of fetal FH and beginning to have deep variable decels.   Thornton/P: Non-reasurring fetal testing, known absent/ reversed EDF at 33'6.  - proceed w/ urgent RCS. Pt aware of risks of prematurity as well as surgical risks- bleeding, infection, damage to internal organs, all increased due to her anterior fibroid, ant placenta, early GA and prior c/s - d/w pt risks of prematurity and pt has decided against TL - htn vs PEC. Will have low threshold to start Mag, will cont bp meds PP, repeat PIH labs in am.  Jamie Thornton. 09/27/2011 1:32 AM

## 2011-09-27 NOTE — Brief Op Note (Signed)
09/26/2011 - 09/27/2011  2:25 AM  PATIENT:  Jamie Thornton  36 y.o. female  PRE-OPERATIVE DIAGNOSIS:  non reassurring fetal testing, placental insufficiency, htn  POST-OPERATIVE DIAGNOSIS:  non reassurring fetal testing, placental insufficiency, htn  PROCEDURE:  Procedure(s) (LRB): CESAREAN SECTION (N/A)  SURGEON:  Surgeon(s) and Role:    * [place] [facility] A. Ernestina Penna, MD - Primary  PHYSICIAN ASSISTANT:   ASSISTANTS: Cousins, MD   ANESTHESIA:   spinal  EBL:  Total I/O In: 610 [P.O.:60; I.V.:550] Out: 600 [Urine:600]  BLOOD ADMINISTERED:none  DRAINS: Urinary Catheter (Foley)   LOCAL MEDICATIONS USED:  NONE  SPECIMEN:  Source of Specimen:  placenta  DISPOSITION OF SPECIMEN:  PATHOLOGY  COUNTS:  YES  TOURNIQUET:  * No tourniquets in log *  DICTATION: .Note written in EPIC  PLAN OF CARE: Admit to inpatient   PATIENT DISPOSITION:  PACU - hemodynamically stable.   Delay start of Pharmacological VTE agent (>24hrs) due to surgical blood loss or risk of bleeding: yes

## 2011-09-27 NOTE — OR Nursing (Signed)
FETAL HEART RATE IN OR 146, CORD PH [phone]

## 2011-09-27 NOTE — Anesthesia Postprocedure Evaluation (Signed)
  Anesthesia Post-op Note  Patient: Jamie Thornton  Procedure(s) Performed: Procedure(s) (LRB): CESAREAN SECTION (N/A)  Patient Location: PACU and Women's Unit  Anesthesia Type: Spinal  Level of Consciousness: awake, alert  and oriented  Airway and Oxygen Therapy: Patient Spontanous Breathing    Post-op Assessment: Patient's Cardiovascular Status Stable and Respiratory Function Stable  Post-op Vital Signs: stable  Complications: No apparent anesthesia complications

## 2011-09-27 NOTE — Anesthesia Procedure Notes (Signed)

## 2011-09-27 NOTE — Addendum Note (Signed)
Addendum  created 09/27/11 2146 by Len Blalock, CRNA   Modules edited:Notes Section

## 2011-09-27 NOTE — Anesthesia Preprocedure Evaluation (Signed)
Anesthesia Evaluation  Patient identified by MRN, date of birth, ID band Patient awake    Reviewed: Allergy & Precautions, H&P , Patient's Chart, lab work & pertinent test results  Airway Mallampati: II TM Distance: >3 FB Neck ROM: full    Dental No notable dental hx.    Pulmonary  breath sounds clear to auscultation  Pulmonary exam normal       Cardiovascular Exercise Tolerance: Good hypertension, On Medications Rhythm:regular Rate:Normal     Neuro/Psych    GI/Hepatic   Endo/Other    Renal/GU      Musculoskeletal   Abdominal   Peds  Hematology   Anesthesia Other Findings   Reproductive/Obstetrics                           Anesthesia Physical Anesthesia Plan  ASA: III and Emergent  Anesthesia Plan: Spinal   Post-op Pain Management:    Induction:   Airway Management Planned:   Additional Equipment:   Intra-op Plan:   Post-operative Plan:   Informed Consent: I have reviewed the patients History and Physical, chart, labs and discussed the procedure including the risks, benefits and alternatives for the proposed anesthesia with the patient or authorized representative who has indicated his/her understanding and acceptance.   Dental Advisory Given  Plan Discussed with: CRNA  Anesthesia Plan Comments: (Lab work confirmed with CRNA in room. Platelets okay. Discussed spinal anesthetic, and patient consents to the procedure:  included risk of possible headache,backache, failed block, allergic reaction, and nerve injury. This patient was asked if she had any questions or concerns before the procedure started. )        Anesthesia Quick Evaluation

## 2011-09-27 NOTE — Transfer of Care (Signed)
Immediate Anesthesia Transfer of Care Note  Patient: Jamie Thornton  Procedure(s) Performed: Procedure(s) (LRB): CESAREAN SECTION (N/A)  Patient Location: PACU  Anesthesia Type: Spinal  Level of Consciousness: awake, alert , oriented and patient cooperative  Airway & Oxygen Therapy: Patient Spontanous Breathing  Post-op Assessment: Report given to PACU RN and Post -op Vital signs reviewed and stable  Post vital signs: Reviewed and stable  Complications: No apparent anesthesia complications

## 2011-09-28 NOTE — Progress Notes (Signed)
PSYCHOSOCIAL ASSESSMENT ~ MATERNAL/CHILD Name: Boy "Earna Coder"         Age: 35 day    Referral Date: 09/27/2011   Reason/Source: NICU admission I. FAMILY/HOME ENVIRONMENT A. Child's Legal Guardian Parent(s)     Name: Marian Meneely   DOB: 1975-08-15    Age: 80 Address:  [address] [place], [place] [address] Name: Belky Mundo Address: same B. Other Household Members/Support Persons Name:  Zabdiel       Relationship: 13 year old brother          Name:  Valeria       Relationship:  23 year old sister         C.   Other Support:  Extended family and friends  II. PSYCHOSOCIAL DATA A. Information Source X Patient Interview  B. Event organiser X Employment:  [employer] company 5+ years  [employer]- [employer]         X WIC   C. Cultural and Environment Information/Cultural Issues Impacting Care: N/A III. STRENGTHS X Supportive family/friends   X Adequate Resources  X Compliance with medical plan  X Home prepared for Child (including basic supplies)                 X Understanding of illness            IV. RISK FACTORS AND CURRENT PROBLEMS        X Other: NICU admission             V. SOCIAL WORK ASSESSMENT Met with MOB at bedside to assess strength, needs and resources following baby's NICU admission.  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  They are very excited they have a baby brother and hope to meet him soon.  MOB and FOB are originally from [place], and they have family and friends who are also excited for baby's arrival.  Paternal grandmother plans to come and spend time with the family next month.  After PGM returns to her home country, maternal grandmother plans to come and visit.  MOB has been in the [place] for about 7 years and has not been able to return for a visit due to the high cost of travel.  The parents and all the children are planning to return to their home country for a visit this July.  MOB reported that  her first born son had to spend a few days in the NICU back in her home country and she is comfortable with the care her newborn is receiving based on that knowledge base.  FOB has taken a few days off from work and plans to return on Tuesday in order to save the vacation time for when the family leaves the country this July.  MOB plans to apply for [employer] for her baby.  MOB would eventually like to go to college in about 7 months to pursue a two year accounting degree.  MOB is very upbeat, positive, energetic, and happy.  She was hopeful about baby's course and is eager to have him home soon.  Despite living in [facility], [place] is committed to seeing her baby everyday while he is in the NICU.  They have everything ready for when baby comes home.  MOB reported no concerns at this time, and she is aware of the supports available to assist her while baby is here.  I discussed NICU brochure and the ways we can support  the family during baby's NICU course.      VI. SOCIAL WORK PLAN X No Further Intervention Required/No Barriers to Discharge X Patient/Family Education:  NICU brochure  Staci Acosta, MSW LCSW, 09/28/2011, 12:15 pm

## 2011-09-28 NOTE — Progress Notes (Signed)
Subjective: POD# 1 s/p LCTS Information for the patient's newborn:  Jamie, Thornton [[id number]]  female   Reports feeling well, mild headache controlled with ibuprofen Feeding: expressing colostrum q 3 Patient reports tolerating PO.  Breast symptoms:none Pain controlled withibuprofen (OTC) and narcotic analgesics including percocet Denies SOB/C/P/N/V/dizziness/blurred vision. Flatus minimal. She reports vaginal bleeding as normal, without clots.  She is ambulating, urinating without difficulty Infant in NICU; stable condition  Objective:   VS:  Filed Vitals:   09/27/11 2143 09/28/11 0100 09/28/11 0359 09/28/11 0552  BP: 142/85 149/81 149/81 158/98  Pulse: 58 71  67  Temp: 97.5 F (36.4 C) 98.8 F (37.1 C)  98.3 F (36.8 C)  TempSrc: Oral Oral  Oral  Resp: 18 18  18   Height:      Weight:      SpO2: 98% 95%  98%     Intake/Output Summary (Last 24 hours) at 09/28/11 1002 Last data filed at 09/27/11 1800  Gross per 24 hour  Intake    960 ml  Output   1950 ml  Net   -990 ml        Basename 09/27/11 0530 09/27/11 0103  WBC 14.2* 10.5  HGB 12.3 12.4  HCT 36.8 37.1  PLT 290 283     Blood type: --/--/A POS, A POS (03/15 1935)  Rubella: Immune (09/19 0000)     Physical Exam:  General: alert CV: S1S2 present or without murmur or extra heart sounds Resp: clear Abdomen: soft, slightly distended but nontender, normal bowel sounds Incision: clean, dry and intact; dressing remains in place Uterine Fundus: firm, below umbilicus, nontender Lochia: minimal Ext: Homans sign is negative, no sign of DVT and no edema, redness or tenderness in the calves or thighs  Assessment/Plan: 36 y.o.   POD# 1.  s/p Cesarean Delivery.  Indications: exacerbation of CHTN with AEDF                Principal Problem:  *PP care - s/p rpt C/S 3/16 Active Problems:  Chronic hypertension with exacerbation during pregnancy BP 140's/80's with one time of 150's/90's early am (c/o pain); if BP  persists in elevated range will consider modifying current antihypertensive regime Doing well, stable.    Increase ambulation; warm PO fluids to increase GI motlility Routine post-op care Anticipate discharge home in AM.  Juanetta Beets SNM Jamie Thornton 09/28/2011, 10:02 AM

## 2011-09-28 NOTE — Progress Notes (Signed)
Dr. Cherly Hensen notified of B/p @1400  157/89 rechecked at 1429 178/93. Pt C/o frontal H/a, no BV or epigastric pain. No new orders at this time. Will continue to monitor.

## 2011-09-29 LAB — CBC
HCT: 34.2 % — ABNORMAL LOW (ref 36.0–46.0)
Hemoglobin: 10.9 g/dL — ABNORMAL LOW (ref 12.0–15.0)
MCH: 27.8 pg (ref 26.0–34.0)
MCHC: 31.9 g/dL (ref 30.0–36.0)
MCV: 87.2 fL (ref 78.0–100.0)
Platelets: 272 10*3/uL (ref 150–400)
RBC: 3.92 MIL/uL (ref 3.87–5.11)
RDW: 15 % (ref 11.5–15.5)
WBC: 10 10*3/uL (ref 4.0–10.5)

## 2011-09-29 LAB — COMPREHENSIVE METABOLIC PANEL
ALT: 13 U/L (ref 0–35)
AST: 24 U/L (ref 0–37)
Albumin: 2.5 g/dL — ABNORMAL LOW (ref 3.5–5.2)
Alkaline Phosphatase: 181 U/L — ABNORMAL HIGH (ref 39–117)
BUN: 11 mg/dL (ref 6–23)
CO2: 27 mEq/L (ref 19–32)
Calcium: 9 mg/dL (ref 8.4–10.5)
Chloride: 101 mEq/L (ref 96–112)
Creatinine, Ser: 0.68 mg/dL (ref 0.50–1.10)
GFR calc Af Amer: >90 mL/min (ref >90)
GFR calc non Af Amer: >90 mL/min (ref >90)
Glucose, Bld: 84 mg/dL (ref 70–99)
Potassium: 3.9 mEq/L (ref 3.5–5.1)
Sodium: 137 mEq/L (ref 135–145)
Total Bilirubin: 0.1 mg/dL — ABNORMAL LOW (ref 0.3–1.2)
Total Protein: 6.3 g/dL (ref 6.0–8.3)

## 2011-09-29 LAB — URIC ACID: Uric Acid, Serum: 4.2 mg/dL (ref 2.4–7.0)

## 2011-09-29 NOTE — Progress Notes (Signed)
UR chart review completed.  

## 2011-09-29 NOTE — Progress Notes (Signed)
Subjective: POD# 2 s/p LCTS Information for the patient's newborn:  Jamie Thornton, Jamie Thornton [[id number]]  female    Reports feeling well, HA last night resolved w/ Tylenol, no other PEC s/s Feeding: expressing colostrum q 3 Patient reports tolerating PO.  Breast symptoms:none Pain controlled withibuprofen (OTC) and narcotic analgesics including percocet Denies SOB/C/P/N/V/dizziness/blurred vision. Flatus present. She reports vaginal bleeding as normal, without clots.  She is ambulating, urinating without difficulty Infant in NICU; stable condition  Objective:   VS:  Filed Vitals:   09/28/11 1800 09/28/11 1946 09/28/11 2300 09/29/11 0500  BP: 177/111 166/92 143/76 159/85  Pulse: 67 71 71 78  Temp: 98.9 F (37.2 C) 98.5 F (36.9 C) 98.8 F (37.1 C) 98.4 F (36.9 C)  TempSrc: Oral Oral Oral Oral  Resp: 20 19 18 18   Height:      Weight:      SpO2: 97% 98% 97% 96%      Intake/Output Summary (Last 24 hours) at 09/29/11 1032 Last data filed at 09/28/11 1539  Gross per 24 hour  Intake     60 ml  Output    300 ml  Net   -240 ml     Lab Results  Component Value Date   ALT 13 09/29/2011   AST 24 09/29/2011   ALKPHOS 181* 09/29/2011   BILITOT 0.1* 09/29/2011       Basename 09/27/11 0530 09/27/11 0103  WBC 14.2* 10.5  HGB 12.3 12.4  HCT 36.8 37.1  PLT 290 283     Blood type: --/--/A POS, A POS (03/15 1935)  Rubella: Immune (09/19 0000)     Physical Exam:  General: alert CV: S1S2 present or without murmur or extra heart sounds Resp: clear Abdomen: soft, slightly distended but nontender, normal bowel sounds Incision: clean, dry and intact; staples intact to LST Uterine Fundus: firm, below umbilicus, nontender Lochia: minimal Ext: Homans sign is negative, no sign of DVT and no edema, redness or tenderness in the calves or thighs  Assessment/Plan: 36 y.o.   POD# 1.  s/p Cesarean Delivery.  Indications: exacerbation of CHTN with AEDF                Active Problems:  PP  care - s/p cesarean section (3/16)  Stable post-op  Continue routine post-op orders  Chronic hypertension with exacerbation during pregnancy  On hydralazine 5 mg PO Q6 hrs  BP range 143/76 - 178/93  No evidence of PEC   Anticipate discharge home in AM.   PAUL,DANIELA 09/29/2011, 10:32 AM

## 2011-09-30 ENCOUNTER — Encounter (HOSPITAL_COMMUNITY): Payer: Self-pay | Admitting: Obstetrics

## 2011-09-30 MED ORDER — NIFEDIPINE ER 30 MG PO TB24
30.0000 mg | ORAL_TABLET | Freq: Every day | ORAL | Status: DC
  Administered 2011-09-30: 30 mg via ORAL
  Filled 2011-09-30 (×2): qty 1

## 2011-09-30 MED ORDER — HYDROCHLOROTHIAZIDE 25 MG PO TABS
25.0000 mg | ORAL_TABLET | Freq: Every day | ORAL | Status: DC
  Administered 2011-09-30: 25 mg via ORAL
  Filled 2011-09-30 (×2): qty 1

## 2011-09-30 NOTE — Progress Notes (Signed)
1000 a.m. Dr. Ernestina Penna called concerning BP. NO NEW OR ders encouraged to continue Bp meds.

## 2011-09-30 NOTE — Progress Notes (Signed)
Jamie Thornton bAILEY NOTIFIED OF PT 'S BLOOD PRESSURE  170/102. INFORMED TO DISCHARGE PT  NEW MEDS. THAT WERE ORDERED FOR HER HAVE NOT YET STartED TO WORK.{PROCARDIA AND HCTZ_. mEDS WOULD BE CALLED IN TO HER DRUG STORE.PT. COMPHERNDED ALL INSTRUCTIONS WELL QUESTIONS ASKED AND ANSWERED WELL. INFANT TO REMAIN IN NICU. Marland Kitchen AMBULATING WELL. EMOTIONAL SUPPORT WAS GIVEN TO MOM DUE TO INFANT NOT GOING HOME WITH HER. TEARFUL UPON DISCHARGE.Marland Kitchen

## 2011-09-30 NOTE — Progress Notes (Signed)
POSTOPERATIVE DAY # 3 S/P cesarean section   S:         Reports feeling well              wants to go home / husband coming to pick her up after 6pm                   needs to care for other children             Tolerating po intake / no  nausea / no  vomiting / + flatus / no BM             Bleeding is light             No PIH signs             Pain controlled with motrin and percocet             Up ad lib / ambulatory  Newborn breast-feeding  / Newborn in NICU   O:  A & O x 3 NAD             VS: Blood pressure 167/105, pulse 70, temperature 98.3 F (36.8 C), temperature source Oral, resp. rate 18, height 5\' 2"  (1.575 m), weight 71.668 kg (158 lb), last menstrual period 02/02/2011, SpO2 97.00%, unknown if currently breastfeeding.  BP range 145-165 / 85-105  Uncontrolled hypertension (no PCP) - hydralazine 5mg  QID not effective for hypertensive control   Lungs: Clear and unlabored  Heart: regular rate and rhythm / no mumurs  Abdomen: soft, non-tender, non-distended              Fundus: firm, non-tender, Ueven             Dressing OFF              Incision:  approximated with staples / no erythema / no ecchymosis / no drainage  Perineum: no edema  Lochia: light  Extremities: 1+ edema, no calf pain or tenderness, negative Homans, DTR 1+ -no clonus  A:        POD # 3 S/P cesarean section            Chronic hypertension - uncontrolled with hydralazine             No evidence of PEC  P:        Routine postoperative care              Will d/c this pm - if BP stable             Give dose procardia 30 XL now and HCTZ 25 mg             Serial BP check this pm prior to discharge             OV Friday at [facility] for staple removal and BP check              Referral to Dr Drue Novel at [facility]                Marlinda Mike 09/30/2011, 1:21 PM

## 2011-09-30 NOTE — Progress Notes (Signed)
POSTOPERATIVE DAY # 3 S/P cesarean section  Patient in NICU with newborn - will revisit at lunch  VS: Blood pressure 162/95, pulse 58, temperature 97.8 F (36.6 C), temperature source Oral, resp. rate 18, height 5\' 2"  (1.575 m), weight 71.668 kg (158 lb), last menstrual period 02/02/2011, SpO2 98.00%, unknown if currently breastfeeding.   A/P:  POD # 3 S/P cesarean section          Chronic hypertension       Revisit at lunch for rounds    Marlinda Mike 09/30/2011, 9:18 AM

## 2011-10-03 ENCOUNTER — Telehealth: Payer: Self-pay | Admitting: Internal Medicine

## 2011-10-03 NOTE — Telephone Encounter (Signed)
Spoke  with a Immunologist at [facility]. Patient has been referred to me, she has a history of chronic hypertension, she is 2 weeks postpartum, her blood pressure is not well controlled at around 148/112. During the pregnancy she was managed to labetalol 300 tid 2 weeks postpartum she is on Procardia XL 30 mg twice a day and HCTZ 25 mg daily. No h/o of preeclampsia: I recommended  to restart  labetalol 200 mg twice a day Please call the patient, I need to see her at some point next week. Overbook okay.

## 2011-10-07 NOTE — Telephone Encounter (Signed)
Patient has appt scheduled for 10-22-11. I offered earlier appt.

## 2011-10-07 NOTE — Telephone Encounter (Signed)
I would like to see her sooner, if you  already asked her to come back sooner and she won't then I will see her as schedule.

## 2011-10-08 NOTE — Discharge Summary (Signed)
POSTOPERATIVE DISCHARGE SUMMARY:  Patient ID: Jamie Thornton MRN: [id number] DOB/AGE: [birth year] 36 y.o.  Admit date: 09/26/2011 Discharge date: 09/30/2011   Admission Diagnoses: reversed end diastolic flow in fetal umbilical artery, gestational hypertension, 33 wks pregnancy    Discharge Diagnoses:   gestational hypertension, placental insufficiency, s/p urgent RCS- LTCS  Prenatal history: G3P1203   EDC : 11/09/2011, by MD Prenatal care at [facility] since 1st trimester  Prenatal course complicated by gestational hypertension. Pt had slowly been increasing home labetalol with persistent high bp's. She had several evaluations for PEC which were always negative. She does have a h/o severe PEC as a G1 with c/s at 28 wks and f/u term SVD/VBAC.   Prenatal Labs: ABO, Rh: A (10/03 0000)  Antibody: NEG (03/15 1935) Rubella: Immune (09/19 0000)  RPR: Nonreactive (03/15 1857)  HBsAg: Negative (03/15 1858)  HIV: Non-reactive (03/15 1858)  GBS:   not done 1 hr Glucola : nl   Medical / Surgical History :  Past medical history:  Past Medical History  Diagnosis Date  . Anemia   . Hypertension   . PP care - s/p rpt C/S 3/16 09/27/2011  . Chronic hypertension with exacerbation during pregnancy 09/27/2011    Past surgical history:  Past Surgical History  Procedure Date  . No past surgeries   . Cesarean section   . Cesarean section 09/27/2011    Procedure: CESAREAN SECTION;  Surgeon: Tresa Endo A. Ernestina Penna, MD;  Location: [facility];  Service: Gynecology;  Laterality: N/A;    Family History:  Family History  Problem Relation Age of Onset  . Anesthesia problems Neg Hx   . Hypotension Neg Hx   . Malignant hyperthermia Neg Hx   . Pseudochol deficiency Neg Hx     Social History:  reports that she has never smoked. She has never used smokeless tobacco. She reports that she does not drink alcohol or use illicit drugs.   Allergies: Review of patient's allergies indicates no  known allergies.    Current Medications at time of admission:  No current facility-administered medications for this encounter. Current outpatient prescriptions:acetaminophen (TYLENOL) 500 MG tablet, Take 1,000 mg by mouth every 6 (six) hours as needed. Takes for pain, Disp: , Rfl: ;  labetalol (NORMODYNE) 100 MG tablet, Take 100-200 mg by mouth 3 (three) times daily. Take 200 mg in the morning, 200 mg in the evening, and 100 mg at night., Disp: , Rfl: ;  Prenatal Vit-Fe Fumarate-FA (PRENATAL MULTIVITAMIN) TABS, Take 1 tablet by mouth daily., Disp: , Rfl:      Intrapartum Course: Pt admitted to hospital after routine evaluation for htn revealed fetal growth at the 18th % but AC at the 4%, UA dopplers were done and baby w/ reversed EDF. Due to concern for bp's and placental insufficiency pt was admitted for bp monitoring, PEC labs, 24 hr urine, BMZ and continuous fetal monitoring. Initially fetal monitoring remained reactive. Repeat BPP/ UA dopplers done with BPP of 8/8 and absent EDF noted- the was after her 1st dose of BMZ. PEC labs returned normal and 24 hr urine was collecting. Pt had continued very high elevations of bp, not repsonding to po or IV labetalol. Pt then received an IV dose of hydralizine and bp normalized. Over the next several hourse BTB variability decreased and persistant late and deep variable decelerations were noted. The decision was made to proceed w/ RCS, no TL.   Procedures: Cesarean section delivery of viable  newborn by Dr Ernestina Penna  See operative report for further details  Postoperative / postpartum course: Pt did well post-op. PEC labs were normal, pain was controlled and pt was without symptoms. Hovever, bp remained elevated and pt was started of po hydralizine and this was titrated up. SHe was discharged to home with plan for bp management by PCP.   Physical Exam:  VSS: Blood pressure 170/102, pulse 91, temperature 98 F (36.7 C), temperature source Oral, resp. rate  18, height 5\' 2"  (1.575 m), weight 71.668 kg (158 lb), last menstrual period 02/02/2011, SpO2 97.00%, unknown if currently breastfeeding.   LABS: No results found for this basename: WBC:2,HGB:2,HCT:2,PLT:2 in the last 72 hours  I&O:        Incision:  approximated with staple / no erythema / no ecchymosis / no drainage   Discharge Instructions:  Discharged Condition: good Activity: pelvic rest Diet: routine Medications:  Medication List  As of 10/08/2011  3:40 PM   ASK your doctor about these medications         acetaminophen 500 MG tablet   Commonly known as: TYLENOL   Take 1,000 mg by mouth every 6 (six) hours as needed. Takes for pain      labetalol 100 MG tablet   Commonly known as: NORMODYNE   Take 100-200 mg by mouth 3 (three) times daily. Take 200 mg in the morning, 200 mg in the evening, and 100 mg at night.      prenatal multivitamin Tabs   Take 1 tablet by mouth daily.           Condition: stable Postpartum Instructions: refer to practice specific booklet Discharge to: home Disposition: 01-Home or Self Care Follow up :    Signed: Cynitha Berte A. 10/08/2011, 3:40 PM

## 2011-10-22 ENCOUNTER — Ambulatory Visit: Payer: Managed Care, Other (non HMO) | Admitting: Internal Medicine

## 2011-10-23 ENCOUNTER — Inpatient Hospital Stay (HOSPITAL_COMMUNITY): Admission: RE | Admit: 2011-10-23 | Payer: Managed Care, Other (non HMO) | Source: Ambulatory Visit

## 2011-11-03 ENCOUNTER — Telehealth: Payer: Self-pay | Admitting: Internal Medicine

## 2011-11-03 NOTE — Telephone Encounter (Signed)
Please call Dr Payton Doughty regarding this patient, cell# [phone]

## 2011-11-03 NOTE — Telephone Encounter (Signed)
Dr.Fogleman's office called and stated no need for a return call. They will fill patients medication until her pending new patient appointment with Dr.Paz

## 2011-11-06 ENCOUNTER — Telehealth: Payer: Self-pay | Admitting: Internal Medicine

## 2011-11-06 NOTE — Telephone Encounter (Signed)
Pt is scheduled for appt w/Dr. Drue Novel 12-12-11.   Please try & reschedule ----- Message ----- From: Wanda Plump, MD Sent: 10/27/2011 12:55 PM To: Nada Maclachlan, MA  She did not show for appointment, call her and offer to reschedule. Document in the chart you did             Forwarded by:     Nada Maclachlan Date: 11/05/2011

## 2011-12-12 ENCOUNTER — Encounter: Payer: Self-pay | Admitting: Internal Medicine

## 2011-12-12 ENCOUNTER — Ambulatory Visit (INDEPENDENT_AMBULATORY_CARE_PROVIDER_SITE_OTHER): Payer: Managed Care, Other (non HMO) | Admitting: Internal Medicine

## 2011-12-12 VITALS — BP 152/96 | HR 80 | Temp 98.9°F | Ht 60.25 in | Wt 140.2 lb

## 2011-12-12 DIAGNOSIS — R519 Headache, unspecified: Secondary | ICD-10-CM | POA: Insufficient documentation

## 2011-12-12 DIAGNOSIS — R51 Headache: Secondary | ICD-10-CM

## 2011-12-12 DIAGNOSIS — D649 Anemia, unspecified: Secondary | ICD-10-CM

## 2011-12-12 DIAGNOSIS — R1013 Epigastric pain: Secondary | ICD-10-CM

## 2011-12-12 DIAGNOSIS — I10 Essential (primary) hypertension: Secondary | ICD-10-CM

## 2011-12-12 MED ORDER — CIMETIDINE 400 MG PO TABS: 400.0000 mg | ORAL_TABLET | Freq: Two times a day (BID) | ORAL | Status: DC

## 2011-12-12 MED ORDER — LABETALOL HCL 100 MG PO TABS: 200.0000 mg | ORAL_TABLET | Freq: Two times a day (BID) | ORAL | Status: DC

## 2011-12-12 MED ORDER — BUTALBITAL-APAP-CAFFEINE 50-325-40 MG PO TABS: 1.0000 | ORAL_TABLET | Freq: Two times a day (BID) | ORAL | Status: DC | PRN

## 2011-12-12 NOTE — Assessment & Plan Note (Signed)
She has been complaining of headaches, her gynecologist prescribing Fioricet after she delivered the baby. Currently taking approximately 2 a day. She requests a refill. I recommend against taking 2 tablets daily, instead recommend to take one a day only and as needed. Concept of rebound headache discussed with the patient. Also advised patient to contact gynecology to see if it is okay to continue Fioricet while breast-feeding.

## 2011-12-12 NOTE — Assessment & Plan Note (Signed)
Reports epigastric pain for 2 weeks, see review of systems. Recommend Tagamet and call if not better.

## 2011-12-12 NOTE — Patient Instructions (Signed)
Check the  blood pressure 2 or 3 times a week, be sure it is between 110/60 and 140/85. If it is consistently higher or lower, let me know Take Tagamet as prescribed for 3 or 4 weeks, call if the pain is not better or if you get worse

## 2011-12-12 NOTE — Assessment & Plan Note (Signed)
Developed preeclampsia during the first pregnancy, BP went back to normal after delivery. Second pregnancy with no complications Developed preeclampsia during her pregnancy, had a C-section 10/2010, BP remained elevated since. She was tolerating labetalol well up until 2 weeks ago when she discontinued. BP remains elevated. Chart reviewed, renal function normal. Hemoglobin today 12.5. Plan: Low-salt diet which she is already careful about ready Restart labetalol, see instructions.

## 2011-12-12 NOTE — Progress Notes (Signed)
  Subjective:    Patient ID: Jamie Thornton, female    DOB: 1975-10-25, 36 y.o.   MRN: [id number]  HPI New patient Here for management of hypertension, see assessment and plan. Essentially she suffered preeclampsia during her third pregnancy, BP remained elevated afterwards. She was taking labetalol up to 2 weeks ago and then she discontinued it. She just doesn't like to take medications, reports no side effects. No ambulatory BPs while she was taking labetalol.  Past medical history HTN, Pre-eclampsia G3P3  Past surgical history Csection x 2 , last 10-2011  Social history Original from [place], moved to [place] ~ 2006 Married, 3 children Tobacco-- no ETOH-- rarely  Stay home mom  Family history Diabetes-- no CAD-- M , MIx2 in her 30s HTN-- M Stroke-- no Colon cancer-- no Breast cancer-- aunt     Review of Systems No chest pain or shortness of breath No lower extremity edema She has mild headache for a while, Fioricet does help. She is breast-feeding. Or further questioning, for the last 2 weeks she has developed mild epigastric pain, worse when she eats. Denies nausea, vomiting or blood in the stools. Denies problems with anxiety or depression.     Objective:   Physical Exam  General -- alert, well-developed No apparent distress.  Neck --no thyromegaly, normal carotid pulses Lungs -- normal respiratory effort, no intercostal retractions, no accessory muscle use, and normal breath sounds.   Heart-- normal rate, regular rhythm, no murmur, and no gallop.   Abdomen--soft, non-tender, no distention, no masses, no HSM, no guarding, and no rigidity.  No bruit. Extremities-- no pretibial edema bilaterally  Neurologic-- alert & oriented X3 and strength normal in all extremities. Psych-- Cognition and judgment appear intact. Alert and cooperative with normal attention span and concentration.  not anxious appearing and not depressed appearing.       Assessment & Plan:

## 2011-12-14 ENCOUNTER — Encounter: Payer: Self-pay | Admitting: Internal Medicine

## 2012-02-11 ENCOUNTER — Ambulatory Visit (INDEPENDENT_AMBULATORY_CARE_PROVIDER_SITE_OTHER): Payer: Managed Care, Other (non HMO) | Admitting: Internal Medicine

## 2012-02-11 VITALS — BP 130/86 | HR 59 | Temp 98.0°F | Wt 136.0 lb

## 2012-02-11 DIAGNOSIS — I10 Essential (primary) hypertension: Secondary | ICD-10-CM

## 2012-02-11 DIAGNOSIS — M2550 Pain in unspecified joint: Secondary | ICD-10-CM

## 2012-02-11 LAB — TSH: TSH: 0.29 u[IU]/mL — ABNORMAL LOW (ref 0.35–5.50)

## 2012-02-11 MED ORDER — LABETALOL HCL 200 MG PO TABS: 200.0000 mg | ORAL_TABLET | Freq: Two times a day (BID) | ORAL | Status: DC

## 2012-02-11 MED ORDER — IBUPROFEN 600 MG PO TABS: 600.0000 mg | ORAL_TABLET | Freq: Two times a day (BID) | ORAL | Status: AC | PRN

## 2012-02-11 NOTE — Assessment & Plan Note (Signed)
BP today 130/86, at home BPs are usually 130/70, 139/70. Discussed possibly increase labetalol dose to get her closer to the 120/80 range. She would like to keep taking the same dose of labetalol and keep working on her lifestyle. Plan: Continue labetalol 200 mg twice a day and reassess in 6 months.

## 2012-02-11 NOTE — Patient Instructions (Addendum)
Ibuprofen (Motrin) 600 mg twice a day as need for pain .This medicine can cause gastritis-ulcers in the stomach, if you develop nausea, stomach pain, change in the color of stools : stop ibuprofen and call us

## 2012-02-11 NOTE — Progress Notes (Signed)
  Subjective:    Patient ID: Jamie Thornton, female    DOB: 1976-02-03, 36 y.o.   MRN: [id number]  HPI Two-month followup Good compliance with labetalol 200 mg twice a day. She also is doing very well with diet and is more active. No apparent side effects.   Past medical history HTN, Pre-eclampsia G3P3  Past surgical history Csection x 2 , last 10-2011  Social history Original from [place], moved to [place] ~ 2006 Married, 3 children Tobacco-- no ETOH-- rarely  Stay home mom  Family history Diabetes-- no CAD-- M , MIx2 in her 30s HTN-- M Stroke-- no Colon cancer-- no Breast cancer-- aunt   Review of Systems Patient is not breast-feeding anymore, plans to see gynecology at some point in the future to get the IUD. Denies dizziness or lower extremity edema.  Reports occasional pain in the shoulders and back without radiation. This is going on for years. 2 months ago she complained of epigastric pain and headache, symptoms are essentially resolved    Objective:   Physical Exam  General -- alert, well-developed, and well-nourished.   Lungs -- normal respiratory effort, no intercostal retractions, no accessory muscle use, and normal breath sounds.   Heart-- normal rate, regular rhythm, no murmur, and no gallop.   Extremities-- no pretibial edema bilaterally; inspection and palpation of the hands and wrists without changes, no synovitis. Neurologic-- alert & oriented X3 and strength normal in all extremities. Psych-- Cognition and judgment appear intact. Alert and cooperative with normal attention span and concentration.  not anxious appearing and not depressed appearing.       Assessment & Plan:

## 2012-02-11 NOTE — Assessment & Plan Note (Signed)
Occasional shoulder and back pain, recommend a stretching, take Motrin as needed (request a prescription). GI side effects discussed. Also aware that Motrin may increase her BP, patient to monitor her blood pressure.

## 2012-02-12 ENCOUNTER — Encounter: Payer: Self-pay | Admitting: Internal Medicine

## 2012-03-03 ENCOUNTER — Other Ambulatory Visit (INDEPENDENT_AMBULATORY_CARE_PROVIDER_SITE_OTHER): Payer: Managed Care, Other (non HMO)

## 2012-03-03 DIAGNOSIS — R946 Abnormal results of thyroid function studies: Secondary | ICD-10-CM

## 2012-03-03 DIAGNOSIS — E0590 Thyrotoxicosis, unspecified without thyrotoxic crisis or storm: Secondary | ICD-10-CM

## 2012-03-03 NOTE — Progress Notes (Signed)
Labs only

## 2012-03-04 LAB — T3, FREE: T3, Free: 2.6 pg/mL (ref 2.3–4.2)

## 2012-03-08 ENCOUNTER — Encounter: Payer: Self-pay | Admitting: *Deleted

## 2012-08-13 ENCOUNTER — Ambulatory Visit: Payer: Managed Care, Other (non HMO) | Admitting: Internal Medicine

## 2012-08-13 DIAGNOSIS — Z0289 Encounter for other administrative examinations: Secondary | ICD-10-CM

## 2012-08-19 ENCOUNTER — Encounter: Payer: Self-pay | Admitting: *Deleted

## 2012-11-17 ENCOUNTER — Other Ambulatory Visit: Payer: Self-pay | Admitting: General Practice

## 2012-11-17 MED ORDER — LABETALOL HCL 200 MG PO TABS: 200.0000 mg | ORAL_TABLET | Freq: Two times a day (BID) | ORAL | Status: DC

## 2012-11-17 NOTE — Telephone Encounter (Signed)
Refill request for Labetalol 200mg  received from [facility]. Pt last seen on 02-11-12 and med last filled on 02-11-12 #180 with 3 refills. Called pt to schedule an appt. Filled with no additional refills until pt is seen.

## 2012-11-19 ENCOUNTER — Other Ambulatory Visit: Payer: Self-pay | Admitting: General Practice

## 2012-11-19 MED ORDER — LABETALOL HCL 200 MG PO TABS: 200.0000 mg | ORAL_TABLET | Freq: Two times a day (BID) | ORAL | Status: DC

## 2012-11-23 ENCOUNTER — Other Ambulatory Visit: Payer: Self-pay | Admitting: General Practice

## 2012-11-23 MED ORDER — LABETALOL HCL 200 MG PO TABS: 200.0000 mg | ORAL_TABLET | Freq: Two times a day (BID) | ORAL | Status: DC

## 2012-12-22 ENCOUNTER — Ambulatory Visit (INDEPENDENT_AMBULATORY_CARE_PROVIDER_SITE_OTHER): Payer: Managed Care, Other (non HMO) | Admitting: Internal Medicine

## 2012-12-22 ENCOUNTER — Encounter: Payer: Self-pay | Admitting: Internal Medicine

## 2012-12-22 VITALS — BP 140/88 | HR 61 | Temp 98.3°F | Wt 127.0 lb

## 2012-12-22 DIAGNOSIS — I10 Essential (primary) hypertension: Secondary | ICD-10-CM

## 2012-12-22 DIAGNOSIS — R51 Headache: Secondary | ICD-10-CM

## 2012-12-22 LAB — CBC WITH DIFFERENTIAL/PLATELET
Basophils Absolute: 0 10*3/uL (ref 0.0–0.1)
Eosinophils Absolute: 0.1 10*3/uL (ref 0.0–0.7)
HCT: 37.5 % (ref 36.0–46.0)
Lymphs Abs: 1.2 10*3/uL (ref 0.7–4.0)
MCV: 84.2 fl (ref 78.0–100.0)
Monocytes Absolute: 0.5 10*3/uL (ref 0.1–1.0)
Neutrophils Relative %: 71.6 % (ref 43.0–77.0)
Platelets: 313 10*3/uL (ref 150.0–400.0)
RDW: 14.7 % — ABNORMAL HIGH (ref 11.5–14.6)

## 2012-12-22 LAB — COMPREHENSIVE METABOLIC PANEL
AST: 20 U/L (ref 0–37)
Albumin: 4.2 g/dL (ref 3.5–5.2)
Alkaline Phosphatase: 64 U/L (ref 39–117)
Calcium: 9.5 mg/dL (ref 8.4–10.5)
Chloride: 106 mEq/L (ref 96–112)
Glucose, Bld: 61 mg/dL — ABNORMAL LOW (ref 70–99)
Potassium: 3.7 mEq/L (ref 3.5–5.1)
Sodium: 140 mEq/L (ref 135–145)
Total Protein: 7.9 g/dL (ref 6.0–8.3)

## 2012-12-22 MED ORDER — NIFEDIPINE ER 60 MG PO TB24: 60.0000 mg | ORAL_TABLET | Freq: Every day | ORAL | Status: DC

## 2012-12-22 MED ORDER — IBUPROFEN 600 MG PO TABS: 600.0000 mg | ORAL_TABLET | Freq: Three times a day (TID) | ORAL | Status: DC | PRN

## 2012-12-22 MED ORDER — LABETALOL HCL 100 MG PO TABS: 100.0000 mg | ORAL_TABLET | Freq: Two times a day (BID) | ORAL | Status: DC

## 2012-12-22 NOTE — Assessment & Plan Note (Addendum)
History of headaches, Likely migraines as they are associated with nausea; they're sporadic, sometimes no HAs x weeks, sometimes twice a week. Good response to ibuprofen. Headaches are not debilitating. No previous  Neurologist eval or brain imaging.Does not recall trying medications such as Imitrex or Topamax. Since symptoms are stable won't  pursue further eval. Motrin PRN, Reassess headaches periodically

## 2012-12-22 NOTE — Assessment & Plan Note (Addendum)
37 year old lady with hypertension since age 5 after she got  pregnant. Not taking labetalol as prescribed mostly because she gets fatigue. Occasionally BPs in the 140s/107. Not on BCP. Plan: Start nifedipine Decrease dose of labetalol, If possible discontinue it. Reassess in 2 months She has a palpable aorta no bruit, rx a renal Doppler ultrasound to rule out RAS Pt education---Low salt diet

## 2012-12-22 NOTE — Patient Instructions (Addendum)
nifedipine as prescribed, decrease labetalol dose to 100 mg twice a day, in few days try to take only half tablet twice a day. Check the  blood pressure 2 or 3 times a week, be sure it is between 110/60 and 140/85. If it is consistently higher or lower, let me know --- Take Motrin 3 times a day as needed and  as soon as you develop headache. --- Next visit in 2 months, fasting. Physical exam.     Sodium-Controlled Diet Sodium is a mineral. It is found in many foods. Sodium may be found naturally or added during the making of a food. The most common form of sodium is salt, which is made up of sodium and chloride. Reducing your sodium intake involves changing your eating habits. The following guidelines will help you reduce the sodium in your diet:  Stop using the salt shaker.  Use salt sparingly in cooking and baking.  Substitute with sodium-free seasonings and spices.  Do not use a salt substitute (potassium chloride) without your caregiver's permission.  Include a variety of fresh, unprocessed foods in your diet.  Limit the use of processed and convenience foods that are high in sodium. USE THE FOLLOWING FOODS SPARINGLY: Breads/Starches  Commercial bread stuffing, commercial pancake or waffle mixes, coating mixes. Waffles. Croutons. Prepared (boxed or frozen) potato, rice, or noodle mixes that contain salt or sodium. Salted [place] fries or hash browns. Salted popcorn, breads, crackers, chips, or snack foods. Vegetables  Vegetables canned with salt or prepared in cream, butter, or cheese sauces. Sauerkraut. Tomato or vegetable juices canned with salt.  Fresh vegetables are allowed if rinsed thoroughly. Fruit  Fruit is okay to eat. Meat and Meat Substitutes  Salted or smoked meats, such as bacon or Canadian bacon, chipped or corned beef, hot dogs, salt pork, luncheon meats, pastrami, ham, or sausage. Canned or smoked fish, poultry, or meat. Processed cheese or cheese spreads,  blue or Roquefort cheese. Battered or frozen fish products. Prepared spaghetti sauce. Baked beans. Reuben sandwiches. Salted nuts. Caviar. Milk  Limit buttermilk to 1 cup per week. Soups and Combination Foods  Bouillon cubes, canned or dried soups, broth, consomm. Convenience (frozen or packaged) dinners with more than 600 mg sodium. Pot pies, pizza, Asian food, fast food cheeseburgers, and specialty sandwiches. Desserts and Sweets  Regular (salted) desserts, pie, commercial fruit snack pies, commercial snack cakes, canned puddings.  Eat desserts and sweets in moderation. Fats and Oils  Gravy mixes or canned gravy. No more than 1 to 2 tbs of salad dressing. Chip dips.  Eat fats and oils in moderation. Beverages  See those listed under the vegetables and milk groups. Condiments  Ketchup, mustard, meat sauces, salsa, regular (salted) and lite soy sauce or mustard. Dill pickles, olives, meat tenderizer. Prepared horseradish or pickle relish. Dutch-processed cocoa. Baking powder or baking soda used medicinally. Worcestershire sauce. "Light" salt. Salt substitute, unless approved by your caregiver. Document Released: 12/20/2001 Document Revised: 09/22/2011 Document Reviewed: 07/23/2009 [facility] Patient Information 2014 [place], [place].

## 2012-12-22 NOTE — Progress Notes (Signed)
  Subjective:    Patient ID: Jamie Thornton, female    DOB: 1975/09/24, 37 y.o.   MRN: [id number]  HPI Routine office visit, we discussed the following Hypertension, on labetalol 200 mg twice a day, reports she sometimes does not take it every day because she feels fatigued. Ambulatory BPs sometimes as high as 147/107 , Elevated BP usually associated with emotional distress. Long history of headaches, see assessment and plan.  Past Medical History  Diagnosis Date  . Anemia   . Hypertension   . PP care - s/p rpt C/S 3/16 09/27/2011  . Chronic hypertension with exacerbation during pregnancy 09/27/2011  . Migraines    Past Surgical History  Procedure Laterality Date  . Cesarean section    . Cesarean section  09/27/2011    Procedure: CESAREAN SECTION;  Surgeon: Tresa Endo A. Ernestina Penna, MD;  Location: [facility];  Service: Gynecology;  Laterality: N/A;   History   Social History  . Marital Status: Unknown    Spouse Name: N/A    Number of Children: 3  . Years of Education: N/A   Occupational History  . Stay home mom     Social History Main Topics  . Smoking status: Never Smoker   . Smokeless tobacco: Never Used  . Alcohol Use: Yes     Comment: rare  . Drug Use: No  . Sexually Active: Yes    Birth Control/ Protection: None   Other Topics Concern  . Not on file   Social History Narrative   Original from [place], moved to [place] ~ 2006    Married, 3 children                Family history  Diabetes-- no  CAD-- M , MIx2 in her 30s  HTN-- M  Stroke-- no  Colon cancer-- no  Breast cancer-- aunt   Review of Systems Admits to some stress but no anxiety or depression per se. Denies nausea, vomiting, diarrhea. No chest pain or shortness of breath Occasionally has upper abdominal discomfort again usually associated with  stress    Objective:   Physical Exam BP 140/88  Pulse 61  Temp(Src) 98.3 F (36.8 C) (Oral)  Wt 127 lb (57.607 kg)  BMI 24.61 kg/m2  SpO2 98%\  General  -- alert, well-developed, nad .    Lungs -- normal respiratory effort, no intercostal retractions, no accessory muscle use, and normal breath sounds.   Heart-- normal rate, regular rhythm, no murmur, and no gallop.   Abdomen--soft, non-tender, no distention, barely palpable Ao at epigastrium w/o bruit or pain Neurologic-- alert & oriented X3 and strength normal in all extremities. Psych-- Cognition and judgment appear intact. Alert and cooperative with normal attention span and concentration.  not anxious appearing and not depressed appearing.         Assessment & Plan:

## 2012-12-23 NOTE — Addendum Note (Signed)
Addended by: Edwena Felty T on: 12/23/2012 11:30 AM   Modules accepted: Orders

## 2012-12-27 ENCOUNTER — Encounter: Payer: Self-pay | Admitting: *Deleted

## 2012-12-27 ENCOUNTER — Telehealth: Payer: Self-pay | Admitting: *Deleted

## 2012-12-27 NOTE — Telephone Encounter (Signed)
Message copied by Nada Maclachlan on Mon Dec 27, 2012  9:38 AM ------      Message from: Wanda Plump      Created: Sat Dec 25, 2012  9:47 PM      Regarding: FW: U/S                   ----- Message -----         From: Duaine Dredge         Sent: 12/23/2012   9:04 AM           To: Wanda Plump, MD      Subject: U/S                                                      Dr. Drue Novel,            In addition to US Renal Artery Stenosis, please add "Renal US"            Thank you ------

## 2012-12-27 NOTE — Telephone Encounter (Signed)
Entered

## 2013-02-22 ENCOUNTER — Encounter: Payer: Managed Care, Other (non HMO) | Admitting: Internal Medicine

## 2013-10-24 ENCOUNTER — Telehealth: Payer: Self-pay

## 2013-10-24 NOTE — Telephone Encounter (Signed)
Left message for call back Non identifiable  Flu vaccine--10/2011 Tdap--09/2011

## 2013-10-25 ENCOUNTER — Encounter: Payer: Self-pay | Admitting: Internal Medicine

## 2013-10-25 ENCOUNTER — Ambulatory Visit (INDEPENDENT_AMBULATORY_CARE_PROVIDER_SITE_OTHER): Payer: 59 | Admitting: Internal Medicine

## 2013-10-25 VITALS — BP 144/93 | HR 80 | Temp 98.0°F | Ht 61.0 in | Wt 126.0 lb

## 2013-10-25 DIAGNOSIS — Z0000 Encounter for general adult medical examination without abnormal findings: Secondary | ICD-10-CM

## 2013-10-25 DIAGNOSIS — R51 Headache: Secondary | ICD-10-CM

## 2013-10-25 DIAGNOSIS — I10 Essential (primary) hypertension: Secondary | ICD-10-CM

## 2013-10-25 MED ORDER — LABETALOL HCL 100 MG PO TABS: 200.0000 mg | ORAL_TABLET | Freq: Two times a day (BID) | ORAL | Status: DC

## 2013-10-25 NOTE — Patient Instructions (Addendum)
Come back fasting: FLP, CMP, CBC, TSH-- dx v70  Take  Labetalol 100 mg:  One tablet twice a day, if your blood pressure is not at goal within 10 days increase to 2 tablets twice a day   Check the  blood pressure   3 times a  week be sure it is between 110/60 and 140/85. Ideal blood pressure is 120/80. If it is consistently higher or lower, let me know  Next visit in 2 months

## 2013-10-25 NOTE — Progress Notes (Signed)
Pre visit review using our clinic review tool, if applicable. No additional management support is needed unless otherwise documented below in the visit note. 

## 2013-10-25 NOTE — Assessment & Plan Note (Signed)
Td 2013 Never had a cscope Labs Diet-exercise discussed

## 2013-10-25 NOTE — Assessment & Plan Note (Signed)
Since the last time she was here, she ran out of labetalol months ago, apparently never tried calcium channel blockers. BP today slightly elevated. Plan: Restart labetalol, see instructions

## 2013-10-25 NOTE — Progress Notes (Signed)
Subjective:    Patient ID: Leone Payor, female    DOB: October 01, 1975, 38 y.o.   MRN: [id number]  DOS:  10/25/2013 Type of  visit: Complete physical exam Has a history of hypertension, migraines. See assessment and plan   ROS Diet-- regular, trying to eat at home Exercise--  Active at work, gym x 1-2 week  No  CP, SOB No palpitations  Denies  nausea, vomiting diarrhea No abdominal pain Denies  blood in the stools (-) cough, sputum production (-) wheezing, chest congestion     Past Medical History  Diagnosis Date  . Anemia   . Hypertension   . PP care - s/p rpt C/S 3/16 09/27/2011  . Chronic hypertension with exacerbation during pregnancy 09/27/2011  . Migraines   . Contraception     none    Past Surgical History  Procedure Laterality Date  . Cesarean section    . Cesarean section  09/27/2011    Procedure: CESAREAN SECTION;  Surgeon: Claiborne Billings A. Pamala Hurry, MD;  Location: [facility];  Service: Gynecology;  Laterality: N/A;    History   Social History  . Marital Status: Married    Spouse Name: N/A    Number of Children: 3  . 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 Smoking status: Never Smoker   . Smokeless tobacco: Never Used  . Alcohol Use: Yes     Comment: rare  . Drug Use: No  . Sexual Activity: Yes    Birth Control/ Protection: None   Other Topics Concern  . Not on file   Social History Narrative   Original from [place], moved to [place] ~ 2006    Married, 3 children              Family History  Problem Relation Age of Onset  . Anesthesia problems Neg Hx   . Hypotension Neg Hx   . Malignant hyperthermia Neg Hx   . Pseudochol deficiency Neg Hx   . Hyperlipidemia Father   . Hypertension Mother   . Hypertension Father   . Diabetes Neg Hx   . CAD Neg Hx   . Stroke Mother 62    CVA x 2  . Colon cancer Neg Hx   . Breast cancer Other     aunt        Medication List       This list is  accurate as of: 10/25/13  5:25 PM.  Always use your most recent med list.               acetaminophen 500 MG tablet  Commonly known as:  TYLENOL  Take 1,000 mg by mouth every 6 (six) hours as needed. Takes for pain     ibuprofen 600 MG tablet  Commonly known as:  ADVIL,MOTRIN  Take 1 tablet (600 mg total) by mouth every 8 (eight) hours as needed for pain.     labetalol 100 MG tablet  Commonly known as:  NORMODYNE  Take 2 tablets (200 mg total) by mouth 2 (two) times daily.           Objective:   Physical Exam BP 144/93  Pulse 80  Temp(Src) 98 F (36.7 C)  Ht 5\' 1"  (1.549 m)  Wt 126 lb (57.153 kg)  BMI 23.82 kg/m2  SpO2 100% General -- alert, well-developed, NAD.  Neck --no thyromegaly , normal carotid pulse  HEENT--  Not pale.   Lungs -- normal respiratory effort, no intercostal retractions, no accessory muscle use, and normal breath sounds.  Heart-- normal rate, regular rhythm, no murmur.  Abdomen-- Not distended, good bowel sounds,soft, non-tender. Extremities-- no pretibial edema bilaterally  Neurologic--  alert & oriented X3. Speech normal, gait normal, strength normal in all extremities.  Psych-- Cognition and judgment appear intact. Cooperative with normal attention span and concentration. No anxious or depressed appearing.      Assessment & Plan:

## 2013-10-25 NOTE — Assessment & Plan Note (Signed)
Ongoing headaches, on and off, occasionally associated with nausea, phono and photophobia. Plan: Refer to neuro

## 2013-10-26 ENCOUNTER — Telehealth: Payer: Self-pay | Admitting: Internal Medicine

## 2013-10-26 NOTE — Telephone Encounter (Signed)
Unable to reach prior to visit  

## 2013-10-26 NOTE — Telephone Encounter (Signed)
Relevant patient education mailed to patient.  

## 2013-10-28 ENCOUNTER — Other Ambulatory Visit (INDEPENDENT_AMBULATORY_CARE_PROVIDER_SITE_OTHER): Payer: 59

## 2013-10-28 DIAGNOSIS — Z0000 Encounter for general adult medical examination without abnormal findings: Secondary | ICD-10-CM

## 2013-10-28 LAB — COMPREHENSIVE METABOLIC PANEL
ALK PHOS: 68 U/L (ref 39–117)
ALT: 17 U/L (ref 0–35)
AST: 22 U/L (ref 0–37)
Albumin: 4 g/dL (ref 3.5–5.2)
BILIRUBIN TOTAL: 0.8 mg/dL (ref 0.3–1.2)
BUN: 13 mg/dL (ref 6–23)
CO2: 29 mEq/L (ref 19–32)
CREATININE: 0.6 mg/dL (ref 0.4–1.2)
Calcium: 9 mg/dL (ref 8.4–10.5)
Chloride: 102 mEq/L (ref 96–112)
GFR: 112.98 mL/min (ref >60.00)
Glucose, Bld: 77 mg/dL (ref 70–99)
Potassium: 3.5 mEq/L (ref 3.5–5.1)
SODIUM: 138 meq/L (ref 135–145)
TOTAL PROTEIN: 7.5 g/dL (ref 6.0–8.3)

## 2013-10-28 LAB — LIPID PANEL
CHOL/HDL RATIO: 4
Cholesterol: 184 mg/dL (ref 0–200)
HDL: 47.1 mg/dL (ref >39.00)
LDL Cholesterol: 114 mg/dL — ABNORMAL HIGH (ref 0–99)
TRIGLYCERIDES: 116 mg/dL (ref 0.0–149.0)
VLDL: 23.2 mg/dL (ref 0.0–40.0)

## 2013-10-28 LAB — CBC WITH DIFFERENTIAL/PLATELET
Basophils Absolute: 0 10*3/uL (ref 0.0–0.1)
Basophils Relative: 0.4 % (ref 0.0–3.0)
EOS PCT: 2 % (ref 0.0–5.0)
Eosinophils Absolute: 0.1 10*3/uL (ref 0.0–0.7)
HEMATOCRIT: 38.9 % (ref 36.0–46.0)
HEMOGLOBIN: 12.8 g/dL (ref 12.0–15.0)
LYMPHS ABS: 1.6 10*3/uL (ref 0.7–4.0)
Lymphocytes Relative: 36.4 % (ref 12.0–46.0)
MCHC: 33 g/dL (ref 30.0–36.0)
MCV: 83.6 fl (ref 78.0–100.0)
MONO ABS: 0.4 10*3/uL (ref 0.1–1.0)
MONOS PCT: 8.5 % (ref 3.0–12.0)
NEUTROS ABS: 2.3 10*3/uL (ref 1.4–7.7)
Neutrophils Relative %: 52.7 % (ref 43.0–77.0)
PLATELETS: 303 10*3/uL (ref 150.0–400.0)
RBC: 4.65 Mil/uL (ref 3.87–5.11)
RDW: 14.5 % (ref 11.5–14.6)
WBC: 4.4 10*3/uL — AB (ref 4.5–10.5)

## 2013-10-28 LAB — TSH: TSH: 1.77 u[IU]/mL (ref 0.35–5.50)

## 2013-10-31 ENCOUNTER — Encounter: Payer: Self-pay | Admitting: *Deleted

## 2013-10-31 ENCOUNTER — Encounter: Payer: Self-pay | Admitting: Internal Medicine

## 2013-12-26 ENCOUNTER — Ambulatory Visit (HOSPITAL_BASED_OUTPATIENT_CLINIC_OR_DEPARTMENT_OTHER)
Admission: RE | Admit: 2013-12-26 | Discharge: 2013-12-26 | Disposition: A | Discharge status: Home / Self Care | Payer: 59 | Source: Ambulatory Visit | Attending: Internal Medicine | Admitting: Internal Medicine

## 2013-12-26 ENCOUNTER — Ambulatory Visit (INDEPENDENT_AMBULATORY_CARE_PROVIDER_SITE_OTHER): Payer: 59 | Admitting: Internal Medicine

## 2013-12-26 ENCOUNTER — Encounter: Payer: Self-pay | Admitting: Internal Medicine

## 2013-12-26 VITALS — BP 124/84 | HR 71 | Temp 98.0°F | Wt 128.0 lb

## 2013-12-26 DIAGNOSIS — M169 Osteoarthritis of hip, unspecified: Secondary | ICD-10-CM | POA: Insufficient documentation

## 2013-12-26 DIAGNOSIS — R51 Headache: Secondary | ICD-10-CM

## 2013-12-26 DIAGNOSIS — M1610 Unilateral primary osteoarthritis, unspecified hip: Secondary | ICD-10-CM | POA: Diagnosis not present

## 2013-12-26 DIAGNOSIS — I10 Essential (primary) hypertension: Secondary | ICD-10-CM

## 2013-12-26 DIAGNOSIS — M2550 Pain in unspecified joint: Secondary | ICD-10-CM

## 2013-12-26 DIAGNOSIS — M25559 Pain in unspecified hip: Secondary | ICD-10-CM | POA: Diagnosis present

## 2013-12-26 DIAGNOSIS — Z3202 Encounter for pregnancy test, result negative: Secondary | ICD-10-CM

## 2013-12-26 NOTE — Assessment & Plan Note (Addendum)
Today he reports a history of bilateral hip and knee pain. No evidence of synovitis in the upper extremities. Reports a family history of hip problems including her younger sister who had a hip replacement. Plan: Continue with ibuprofen as needed as she is doing. Get a hip x-ray. Addendum, XR w/ severe DJD, refer to ortho

## 2013-12-26 NOTE — Progress Notes (Signed)
Subjective:    Patient ID: Jamie Thornton, female    DOB: 02-Sep-1975, 38 y.o.   MRN: [id number]  DOS:  12/26/2013 Type of  Visit: Routine History: Hypertension, on labetalol 1 tablet twice a day, ambulatory BPs range from 150, 140/80. No apparent side effects. Also has a history of headaches, HAs have  decrease to one or 2 headaches a week, they are  Not as  intense and usually respond to ibuprofen. Also, complaining of joint aches mostly in the knees and hips. Usually after standing all day at work. Denies swelling or redness in the knees. Denies pain or swelling at the wrists or hands.   ROS See HPI  Past Medical History  Diagnosis Date  . Anemia   . Hypertension   . PP care - s/p rpt C/S 3/16 09/27/2011  . Chronic hypertension with exacerbation during pregnancy 09/27/2011  . Migraines   . Contraception     none    Past Surgical History  Procedure Laterality Date  . Cesarean section    . Cesarean section  09/27/2011    Procedure: CESAREAN SECTION;  Surgeon: Claiborne Billings A. Pamala Hurry, MD;  Location: [facility];  Service: Gynecology;  Laterality: N/A;    History   Social History  . Marital Status: Married    Spouse Name: N/A    Number of Children: 3  . 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 Smoking status: Never Smoker   . Smokeless tobacco: Never Used  . Alcohol Use: Yes     Comment: rare  . Drug Use: No  . Sexual Activity: Yes    Birth Control/ Protection: None   Other Topics Concern  . Not on file   Social History Narrative   Original from [place], moved to [place] ~ 2006    Married, 3 children                   Medication List       This list is accurate as of: 12/26/13  5:25 PM.  Always use your most recent med list.               acetaminophen 500 MG tablet  Commonly known as:  TYLENOL  Take 1,000 mg by mouth every 6 (six) hours as needed. Takes for pain     ibuprofen 600 MG tablet    Commonly known as:  ADVIL,MOTRIN  Take 1 tablet (600 mg total) by mouth every 8 (eight) hours as needed for pain.     labetalol 100 MG tablet  Commonly known as:  NORMODYNE  Take 2 tablets (200 mg total) by mouth 2 (two) times daily.           Objective:   Physical Exam BP 124/84  Pulse 71  Temp(Src) 98 F (36.7 C)  Wt 128 lb (58.06 kg)  SpO2 98%  LMP 12/12/2013 General -- alert, well-developed, NAD.   Lungs -- normal respiratory effort, no intercostal retractions, no accessory muscle use, and normal breath sounds.  Heart-- normal rate, regular rhythm, no murmur.  Extremities-- no pretibial edema bilaterally ; Hands , wrists ad knees  normal to inspection on palpation; hip rotation normal bilaterally. Neurologic--  alert & oriented X3. Speech normal, gait appropriate for age, strength symmetric and appropriate for age.  Psych-- Cognition and judgment appear intact. Cooperative with normal attention span and concentration. No anxious or depressed appearing.  Assessment & Plan:

## 2013-12-26 NOTE — Assessment & Plan Note (Signed)
Patient taking only one g twice a day, ambulatory BPs feels sometimes as high as 149/88. Plan: To take labetalol 2 tablets twice a day, continue monitoring BPs

## 2013-12-26 NOTE — Patient Instructions (Signed)
Get the XR at [facility], corner of [address] (10 minutes form here); they are open 24/7 [place], [place] [address] [phone]    Take  labetalol 2 tablets twice a day  Check the  blood pressure 2 or 3 times a month / week be sure it is between 110/60 and 140/85. Ideal blood pressure is 120/80. If it is consistently higher or lower, let me know  Next visit 6 months  Call if the headache is not improving

## 2013-12-26 NOTE — Progress Notes (Signed)
Pre visit review using our clinic review tool, if applicable. No additional management support is needed unless otherwise documented below in the visit note. 

## 2013-12-26 NOTE — Assessment & Plan Note (Signed)
See previous entries,  did not see neurology however headaches are definitely improving (decreased from daily headaches to headaches one or 2 times a week usually  responsive to ibuprofen). I would like to prescribe Topamax but it is pregnancy category D. Plan: Continue with ibuprofen as needed for now, she will take a higher dose of beta blockers, that may help as well. Reassess on return to the office.

## 2014-01-16 ENCOUNTER — Ambulatory Visit: Payer: 59 | Admitting: Internal Medicine

## 2014-01-20 ENCOUNTER — Encounter: Payer: Self-pay | Admitting: Internal Medicine

## 2014-01-20 ENCOUNTER — Ambulatory Visit (INDEPENDENT_AMBULATORY_CARE_PROVIDER_SITE_OTHER): Payer: 59 | Admitting: Internal Medicine

## 2014-01-20 VITALS — BP 149/70 | HR 97 | Temp 97.8°F | Wt 127.0 lb

## 2014-01-20 DIAGNOSIS — S239XXA Sprain of unspecified parts of thorax, initial encounter: Secondary | ICD-10-CM

## 2014-01-20 DIAGNOSIS — IMO0002 Reserved for concepts with insufficient information to code with codable children: Secondary | ICD-10-CM

## 2014-01-20 MED ORDER — CYCLOBENZAPRINE HCL 5 MG PO TABS
5.0000 mg | ORAL_TABLET | Freq: Three times a day (TID) | ORAL | Status: DC | PRN
Start: 2014-01-20 — End: 2014-05-03

## 2014-01-20 NOTE — Progress Notes (Signed)
Subjective:    Patient ID: Jamie Thornton, female    DOB: 05-28-1976, 38 y.o.   MRN: [id number]  DOS:  01/20/2014 Type of visit - description: acute, back pain History: Last week, she did more frequent lifting at work, by the end of the week   developed pain @ mid thoracic back without radiation. This week was unable to go to work for 3 days due to pain. Taking occ OTCs Symptoms increased by bending her torso  ROS Denies fevers No rash No lower extremity paresthesias, bladder or bowel incontinence.   Past Medical History  Diagnosis Date  . Anemia   . Hypertension   . PP care - s/p rpt C/S 3/16 09/27/2011  . Chronic hypertension with exacerbation during pregnancy 09/27/2011  . Migraines   . Contraception     none    Past Surgical History  Procedure Laterality Date  . Cesarean section    . Cesarean section  09/27/2011    Procedure: CESAREAN SECTION;  Surgeon: Claiborne Billings A. Pamala Hurry, MD;  Location: [facility];  Service: Gynecology;  Laterality: N/A;    History   Social History  . Marital Status: Married    Spouse Name: N/A    Number of Children: 3  . 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 Smoking status: Never Smoker   . Smokeless tobacco: Never Used  . Alcohol Use: Yes     Comment: rare  . Drug Use: No  . Sexual Activity: Yes    Birth Control/ Protection: None   Other Topics Concern  . Not on file   Social History Narrative   Original from [place], moved to [place] ~ 2006    Married, 3 children                   Medication List       This list is accurate as of: 01/20/14 11:59 PM.  Always use your most recent med list.               acetaminophen 500 MG tablet  Commonly known as:  TYLENOL  Take 1,000 mg by mouth every 6 (six) hours as needed. Takes for pain     cyclobenzaprine 5 MG tablet  Commonly known as:  FLEXERIL  Take 1 tablet (5 mg total) by mouth 3 (three) times daily as needed  (back pain).     ibuprofen 600 MG tablet  Commonly known as:  ADVIL,MOTRIN  Take 1 tablet (600 mg total) by mouth every 8 (eight) hours as needed for pain.     labetalol 100 MG tablet  Commonly known as:  NORMODYNE  Take 2 tablets (200 mg total) by mouth 2 (two) times daily.           Objective:   Physical Exam  Skin:      BP 149/70  Pulse 97  Temp(Src) 97.8 F (36.6 C)  Wt 127 lb (57.607 kg)  SpO2 98%  LMP 12/12/2013 General -- alert, well-developed, NAD.  Back-- no TTP Extremities-- no pretibial edema bilaterally  Neurologic--  alert & oriented X3. Speech normal, gait appropriate for age and not antalgic, strength symmetric and appropriate for age.  DTRs symmetric. Straight leg test Psych-- Cognition and judgment appear intact. Cooperative with normal attention span and concentration. No anxious or depressed appearing.        Assessment & Plan:  Back sprain, Symptoms  consistent with a back sprain, recommend rest, heating pad, Flexeril, Tylenol or ibuprofen as needed. Will call if not better.

## 2014-01-20 NOTE — Progress Notes (Signed)
Pre visit review using our clinic review tool, if applicable. No additional management support is needed unless otherwise documented below in the visit note. 

## 2014-01-20 NOTE — Patient Instructions (Signed)
Take Tylenol or Motrin as needed for pain Take Flexeril as needed for pain, may cause drowsiness Use a heating pad Call if not improving in the next 7 to 10 days

## 2014-01-23 ENCOUNTER — Telehealth: Payer: Self-pay

## 2014-01-23 NOTE — Telephone Encounter (Signed)
Patient called and states that her work needs a letter documenting what duties she can perform. Full duty? She was in last week for back strain.   Please Advise

## 2014-01-24 NOTE — Telephone Encounter (Signed)
Statement done (ligh duty, limit to no more than 10 pounds)

## 2014-05-03 ENCOUNTER — Encounter: Payer: Self-pay | Admitting: Medical

## 2014-05-03 ENCOUNTER — Ambulatory Visit (INDEPENDENT_AMBULATORY_CARE_PROVIDER_SITE_OTHER): Payer: 59 | Admitting: Medical

## 2014-05-03 VITALS — BP 159/96 | HR 76 | Temp 99.0°F | Ht 65.1 in | Wt 129.0 lb

## 2014-05-03 DIAGNOSIS — R51 Headache: Secondary | ICD-10-CM

## 2014-05-03 DIAGNOSIS — R519 Headache, unspecified: Secondary | ICD-10-CM | POA: Insufficient documentation

## 2014-05-03 MED ORDER — CYCLOBENZAPRINE HCL 5 MG PO TABS: 5.0000 mg | ORAL_TABLET | Freq: Every day | ORAL | Status: DC

## 2014-05-03 MED ORDER — KETOROLAC TROMETHAMINE 60 MG/2ML IM SOLN
60.0000 mg | Freq: Once | INTRAMUSCULAR | Status: AC
  Administered 2014-05-03: 60 mg via INTRAMUSCULAR

## 2014-05-03 MED ORDER — DICLOFENAC SODIUM 75 MG PO TBEC: 75.0000 mg | DELAYED_RELEASE_TABLET | Freq: Two times a day (BID) | ORAL | Status: DC

## 2014-05-03 MED ORDER — PROMETHAZINE HCL 25 MG/ML IJ SOLN
25.0000 mg | Freq: Once | INTRAMUSCULAR | Status: AC
  Administered 2014-05-03: 25 mg via INTRAMUSCULAR

## 2014-05-03 NOTE — Progress Notes (Signed)
   Subjective:    Patient ID: Jamie Thornton, female    DOB: 06-Feb-1976, 38 y.o.   MRN: [id number]  HPI  Pt in with ha for more than 3 days. Pt states ha is moderate to severe. Took tylenol but did not help. Pt tired but no nausea or vomiting. Pt does not have history of chronic HA. Mild light sensitive now. No sound sensitive.No uri or allergy signs or symptoms. No gross motor or sensory function deficits.   LMP- beginning of October.  In past with rare  occassinoal ha would stop with tylenol.    Pt states a lot stress recently. 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 Some neck pain recently.    Review of Systems  Constitutional: Positive for fatigue. Negative for fever and chills.       Mild fatigue.  HENT: Negative for congestion, drooling, ear pain, facial swelling, hearing loss, nosebleeds, postnasal drip, rhinorrhea, sinus pressure, sneezing, sore throat and trouble swallowing.   Respiratory: Negative for choking, chest tightness, shortness of breath and wheezing.   Cardiovascular: Negative for chest pain and palpitations.  Gastrointestinal: Negative.   Musculoskeletal: Negative.   Neurological: Positive for headaches. Negative for dizziness, tremors, seizures, syncope, facial asymmetry, speech difficulty, weakness, light-headedness and numbness.       Mild light sensitive and mild neck pain. Points to trapezius areas.  Hematological: Negative for adenopathy. Does not bruise/bleed easily.  Psychiatric/Behavioral:       Describes daily moderate level of stress with 3 kids and full time job working 2nd shift.       Objective:   Physical Exam  General Mental Status- Alert. General Appearance- Not in acute distress.   Skin General: Color- Normal Color. Moisture- Normal Moisture.  Neck Carotid Arteries- Normal color. Moisture- Normal Moisture. No carotid bruits. No JVD. Patient exhibits good rom of neck. No stiffness but she does have sore trapezius muscles on palpation  both sides. No cspine tenderness.  Chest and Lung Exam Auscultation: Breath Sounds:-Normal. CTA.  Cardiovascular Auscultation:Rythm- Regular, rate and rhythm. Murmurs & Other Heart Sounds:Auscultation of the heart reveals- No Murmurs.  Abdomen Inspection:-Inspeection Normal. Palpation/Percussion:Note:No mass. Palpation and Percussion of the abdomen reveal- Non Tender, Non Distended + BS, no rebound or guarding.    Neurologic Cranial Nerve exam:- CN III-XII intact(No nystagmus), symmetric smile. Drift Test:- No drift. Romberg Exam:- Negative.  Finger to Nose:- Normal/Intact Strength:- 5/5 equal and symmetric strength both upper and lower extremities. Neck from. Trapezius tight.         Assessment & Plan:

## 2014-05-03 NOTE — Assessment & Plan Note (Signed)
In office toradol and phenergan im. Pt husband to drive pt home. Diclofenac and flexeril rx. See AVS for details of plan communicated to pt.

## 2014-05-03 NOTE — Patient Instructions (Signed)
You appear to have probable tension ha. We gave you toradol and phenergan im. I am prescribing diclofenac and cyclobenzaprine as well. If your ha worsens or if you get neck stiffness then ED evaluation.  Follow up in 7 days or as needed. It is important for [place] to know if your ha is resolving or if reoccurring as this would guide [place] if further work up, imaging or referral to specialist is needed.

## 2014-05-03 NOTE — Progress Notes (Signed)
Pre visit review using our clinic review tool, if applicable. No additional management support is needed unless otherwise documented below in the visit note. 

## 2014-05-15 ENCOUNTER — Encounter: Payer: Self-pay | Admitting: Medical

## 2014-06-23 ENCOUNTER — Ambulatory Visit (INDEPENDENT_AMBULATORY_CARE_PROVIDER_SITE_OTHER): Payer: 59 | Admitting: Internal Medicine

## 2014-06-23 ENCOUNTER — Encounter: Payer: Self-pay | Admitting: Internal Medicine

## 2014-06-23 ENCOUNTER — Ambulatory Visit (INDEPENDENT_AMBULATORY_CARE_PROVIDER_SITE_OTHER): Payer: 59

## 2014-06-23 VITALS — BP 144/87 | HR 91 | Temp 98.2°F | Wt 126.5 lb

## 2014-06-23 DIAGNOSIS — I10 Essential (primary) hypertension: Secondary | ICD-10-CM

## 2014-06-23 DIAGNOSIS — Z23 Encounter for immunization: Secondary | ICD-10-CM

## 2014-06-23 DIAGNOSIS — M2550 Pain in unspecified joint: Secondary | ICD-10-CM

## 2014-06-23 LAB — CBC WITH DIFFERENTIAL/PLATELET
BASOS ABS: 0 10*3/uL (ref 0.0–0.1)
Basophils Relative: 0.4 % (ref 0.0–3.0)
EOS ABS: 0.1 10*3/uL (ref 0.0–0.7)
Eosinophils Relative: 2.1 % (ref 0.0–5.0)
HEMATOCRIT: 35.5 % — AB (ref 36.0–46.0)
HEMOGLOBIN: 11.3 g/dL — AB (ref 12.0–15.0)
LYMPHS ABS: 1.2 10*3/uL (ref 0.7–4.0)
Lymphocytes Relative: 32.7 % (ref 12.0–46.0)
MCHC: 31.9 g/dL (ref 30.0–36.0)
MCV: 80.8 fl (ref 78.0–100.0)
MONO ABS: 0.3 10*3/uL (ref 0.1–1.0)
MONOS PCT: 8.7 % (ref 3.0–12.0)
NEUTROS ABS: 2 10*3/uL (ref 1.4–7.7)
Neutrophils Relative %: 56.1 % (ref 43.0–77.0)
PLATELETS: 352 10*3/uL (ref 150.0–400.0)
RBC: 4.4 Mil/uL (ref 3.87–5.11)
RDW: 15.5 % (ref 11.5–15.5)
WBC: 3.7 10*3/uL — ABNORMAL LOW (ref 4.0–10.5)

## 2014-06-23 LAB — BASIC METABOLIC PANEL
BUN: 10 mg/dL (ref 6–23)
CALCIUM: 9.1 mg/dL (ref 8.4–10.5)
CO2: 28 mEq/L (ref 19–32)
CREATININE: 0.6 mg/dL (ref 0.4–1.2)
Chloride: 105 mEq/L (ref 96–112)
GFR: 128.97 mL/min (ref >60.00)
Glucose, Bld: 92 mg/dL (ref 70–99)
Potassium: 4.8 mEq/L (ref 3.5–5.1)
SODIUM: 136 meq/L (ref 135–145)

## 2014-06-23 MED ORDER — NIFEDIPINE ER OSMOTIC RELEASE 30 MG PO TB24: 30.0000 mg | ORAL_TABLET | Freq: Every day | ORAL | Status: DC

## 2014-06-23 NOTE — Patient Instructions (Addendum)
Get your blood work before you leave   Add nifedipine ER 1 tab a day  Check the  blood pressure 2 or 3 times a month  Be sure your blood pressure is between  145/85  and 110/65.  if it is consistently higher or lower, let me know  If you think you are pregnant stop meds and call us    Please come back to the office in 5-6 months   for a routine check up

## 2014-06-23 NOTE — Progress Notes (Signed)
Subjective:    Patient ID: Jamie Thornton, female    DOB: Oct 24, 1975, 37 y.o.   MRN: [id number]  DOS:  06/23/2014 Type of visit - description : f/u Interval history: Hypertension, taking labetalol twice a day, compliance has improved but is not 100%. Would like labs done today including her hemoglobin  ROS Continue with hip pain, also knee pain sometimes. Unable to exercise much. Needs improving in her diet. Patient not on birth control pills  Past Medical History  Diagnosis Date  . Anemia   . Hypertension   . PP care - s/p rpt C/S 3/16 09/27/2011  . Chronic hypertension with exacerbation during pregnancy 09/27/2011  . Migraines   . Contraception     none    Past Surgical History  Procedure Laterality Date  . Cesarean section    . Cesarean section  09/27/2011    Procedure: CESAREAN SECTION;  Surgeon: Claiborne Billings A. Pamala Hurry, MD;  Location: [facility];  Service: Gynecology;  Laterality: N/A;    History   Social History  . Marital Status: Married    Spouse Name: N/A    Number of Children: 3  . 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 Smoking status: Never Smoker   . Smokeless tobacco: Never Used  . Alcohol Use: Yes     Comment: rare  . Drug Use: No  . Sexual Activity: Yes    Birth Control/ Protection: None   Other Topics Concern  . Not on file   Social History Narrative   Original from [place], moved to [place] ~ 2006    Married, 3 children                   Medication List       This list is accurate as of: 06/23/14 11:59 PM.  Always use your most recent med list.               acetaminophen 500 MG tablet  Commonly known as:  TYLENOL  Take 1,000 mg by mouth every 6 (six) hours as needed. Takes for pain     cyclobenzaprine 5 MG tablet  Commonly known as:  FLEXERIL  Take 1 tablet (5 mg total) by mouth at bedtime.     diclofenac 75 MG EC tablet  Commonly known as:  VOLTAREN  Take 1 tablet  (75 mg total) by mouth 2 (two) times daily.     ibuprofen 600 MG tablet  Commonly known as:  ADVIL,MOTRIN  Take 1 tablet (600 mg total) by mouth every 8 (eight) hours as needed for pain.     labetalol 100 MG tablet  Commonly known as:  NORMODYNE  Take 2 tablets (200 mg total) by mouth 2 (two) times daily.     NIFEdipine 30 MG 24 hr tablet  Commonly known as:  PROCARDIA-XL/ADALAT-CC/NIFEDICAL-XL  Take 1 tablet (30 mg total) by mouth daily.           Objective:   Physical Exam BP 144/87 mmHg  Pulse 91  Temp(Src) 98.2 F (36.8 C) (Oral)  Wt 126 lb 8 oz (57.38 kg)  SpO2 95%  LMP 06/13/2014 General -- alert, well-developed, NAD.  Neck --no thyromegaly  HEENT-- Not pale.   Lungs -- normal respiratory effort, no intercostal retractions, no accessory muscle use, and normal breath sounds.  Heart-- normal rate, regular rhythm, no murmur.   Extremities-- no pretibial edema bilaterally  Neurologic--  alert & oriented X3. Speech normal, gait appropriate for age, strength symmetric and appropriate for age.   Psych-- Cognition and judgment appear intact. Cooperative with normal attention span and concentration. No anxious or depressed appearing.        Assessment & Plan:

## 2014-06-23 NOTE — Progress Notes (Signed)
Pre visit review using our clinic review tool, if applicable. No additional management support is needed unless otherwise documented below in the visit note. 

## 2014-06-24 NOTE — Assessment & Plan Note (Signed)
Better compliance with labetalol compared to before, ambulatory BPs 140, 169 with a diastolic in the 45W. Needs some improvement. Plan: Add nifedipineER 30 mg daily, very aware of that needs to let me know if she gets pregnant. Labs. Low salt diet

## 2014-06-24 NOTE — Assessment & Plan Note (Signed)
Already saw orthopedic surgery for hip pain, they're talking about surgery. Also has knee pain, recommend to discuss with ortho

## 2014-09-19 ENCOUNTER — Other Ambulatory Visit: Payer: Self-pay | Admitting: Surgical

## 2014-11-15 ENCOUNTER — Encounter (HOSPITAL_COMMUNITY): Admission: RE | Payer: Self-pay | Source: Ambulatory Visit

## 2014-11-15 ENCOUNTER — Inpatient Hospital Stay (HOSPITAL_COMMUNITY): Admission: RE | Admit: 2014-11-15 | Payer: Self-pay | Source: Ambulatory Visit | Admitting: Orthopedic Surgery

## 2014-11-15 SURGERY — ARTHROPLASTY, HIP, TOTAL,POSTERIOR APPROACH
Anesthesia: General | Site: Hip | Laterality: Right

## 2014-12-26 ENCOUNTER — Other Ambulatory Visit: Payer: Self-pay

## 2014-12-26 DIAGNOSIS — M169 Osteoarthritis of hip, unspecified: Secondary | ICD-10-CM | POA: Insufficient documentation

## 2014-12-26 DIAGNOSIS — Q6589 Other specified congenital deformities of hip: Secondary | ICD-10-CM | POA: Insufficient documentation

## 2014-12-27 ENCOUNTER — Encounter: Payer: Self-pay | Admitting: Internal Medicine

## 2014-12-27 ENCOUNTER — Ambulatory Visit (INDEPENDENT_AMBULATORY_CARE_PROVIDER_SITE_OTHER): Payer: Medicaid Other | Admitting: Internal Medicine

## 2014-12-27 VITALS — BP 142/84 | HR 84 | Temp 98.2°F | Ht 61.5 in | Wt 134.4 lb

## 2014-12-27 DIAGNOSIS — I10 Essential (primary) hypertension: Secondary | ICD-10-CM | POA: Diagnosis not present

## 2014-12-27 LAB — BASIC METABOLIC PANEL
BUN: 17 mg/dL (ref 6–23)
CO2: 30 mEq/L (ref 19–32)
CREATININE: 0.67 mg/dL (ref 0.40–1.20)
Calcium: 9.5 mg/dL (ref 8.4–10.5)
Chloride: 101 mEq/L (ref 96–112)
GFR: 104.57 mL/min (ref >60.00)
Glucose, Bld: 77 mg/dL (ref 70–99)
Potassium: 3.8 mEq/L (ref 3.5–5.1)
Sodium: 135 mEq/L (ref 135–145)

## 2014-12-27 MED ORDER — LABETALOL HCL 200 MG PO TABS: 400.0000 mg | ORAL_TABLET | Freq: Two times a day (BID) | ORAL | Status: DC

## 2014-12-27 MED ORDER — NIFEDIPINE ER OSMOTIC RELEASE 30 MG PO TB24: 30.0000 mg | ORAL_TABLET | Freq: Every day | ORAL | Status: AC

## 2014-12-27 MED ORDER — DICLOFENAC SODIUM 75 MG PO TBEC: 75.0000 mg | DELAYED_RELEASE_TABLET | Freq: Every day | ORAL | Status: DC | PRN

## 2014-12-27 NOTE — Assessment & Plan Note (Addendum)
Ambulatory BPs still a little high. Extensive discussion about diet, exercise limited now due to pain. Plan: BMP Minimize NSAIDs, see instructions Increase labetalol to 400 twice a day, continue Procardia. If increasing beta blockers not well tolerated, will increase calcium channel blocker instead. Patient will let me know.

## 2014-12-27 NOTE — Patient Instructions (Signed)
Please get blood work before you leave  Increase labetalol dose Continue Procardia  For pain: Tylenol  500 mg OTC 2 tabs a day every 8 hours as needed for pain If the pain continue, take one diclofenac or a OTC ibuprofen   Check the  blood pressure 2 or 3 times a  Week   Be sure your blood pressure is between 110/65 and  145/85.  if it is consistently higher or lower, let me know    MYFITNESSPAL

## 2014-12-27 NOTE — Progress Notes (Signed)
Subjective:    Patient ID: Jamie Thornton, female    DOB: June 28, 1976, 39 y.o.   MRN: [id number]  DOS:  12/27/2014 Type of visit - description : ROV Interval history: Hypertension, good compliance of medication, ambulatory BPs 140/80, 90. Hip pain, surgery pending, taking on average one dose of NSAID daily.    Review of Systems Denies chest pain or difficulty breathing No nausea, vomiting, blood in the stools. Unable to exercise due to hip pain, doing okay with diet. Still eating more salt than what she should  Past Medical History  Diagnosis Date  . Anemia   . Hypertension   . PP care - s/p rpt C/S 3/16 09/27/2011  . Chronic hypertension with exacerbation during pregnancy 09/27/2011  . Migraines   . Contraception     none    Past Surgical History  Procedure Laterality Date  . Cesarean section    . Cesarean section  09/27/2011    Procedure: CESAREAN SECTION;  Surgeon: Claiborne Billings A. Pamala Hurry, MD;  Location: [facility];  Service: Gynecology;  Laterality: N/A;    History   Social History  . Marital Status: Married    Spouse Name: N/A  . Number of Children: 3  . 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 Smoking status: Never Smoker   . Smokeless tobacco: Never Used  . Alcohol Use: Yes     Comment: rare  . Drug Use: No  . Sexual Activity: Yes    Birth Control/ Protection: None   Other Topics Concern  . Not on file   Social History Narrative   Original from [place], moved to [place] ~ 2006    Married, 3 children                   Medication List       This list is accurate as of: 12/27/14 11:59 PM.  Always use your most recent med list.               acetaminophen 500 MG tablet  Commonly known as:  TYLENOL  Take 1,000 mg by mouth every 6 (six) hours as needed. Takes for pain     diclofenac 75 MG EC tablet  Commonly known as:  VOLTAREN  Take 1 tablet (75 mg total) by mouth daily as needed.     ibuprofen 600 MG tablet  Commonly known as:  ADVIL,MOTRIN  Take 1 tablet (600 mg total) by mouth every 8 (eight) hours as needed for pain.     labetalol 200 MG tablet  Commonly known as:  NORMODYNE  Take 2 tablets (400 mg total) by mouth 2 (two) times daily.     NIFEdipine 30 MG 24 hr tablet  Commonly known as:  PROCARDIA-XL/ADALAT-CC/NIFEDICAL-XL  Take 1 tablet (30 mg total) by mouth daily.           Objective:   Physical Exam BP 142/84 mmHg  Pulse 84  Temp(Src) 98.2 F (36.8 C) (Oral)  Ht 5' 1.5" (1.562 m)  Wt 134 lb 6 oz (60.952 kg)  BMI 24.98 kg/m2  SpO2 98%  General:   Well developed, well nourished . NAD.  HEENT:  Normocephalic . Face symmetric, atraumatic Lungs:  CTA B Normal respiratory effort, no intercostal retractions, no accessory muscle use. Heart: RRR,  no murmur.  No pretibial edema bilaterally  Skin: Not pale. Not jaundice Neurologic:  alert & oriented X3.  Speech normal, gait appropriate for age and unassisted Psych--  Cognition and judgment appear intact.  Cooperative with normal attention span and concentration.  Behavior appropriate. No anxious or depressed appearing.       Assessment & Plan:

## 2014-12-27 NOTE — Progress Notes (Signed)
Pre visit review using our clinic review tool, if applicable. No additional management support is needed unless otherwise documented below in the visit note. 

## 2015-01-25 DIAGNOSIS — M162 Bilateral osteoarthritis resulting from hip dysplasia: Secondary | ICD-10-CM | POA: Insufficient documentation

## 2015-02-01 ENCOUNTER — Other Ambulatory Visit: Payer: Self-pay

## 2015-03-26 ENCOUNTER — Telehealth: Payer: Self-pay | Admitting: *Deleted

## 2015-03-26 NOTE — Telephone Encounter (Signed)
Pt signed ROI received via fax from [facility]. Forwarded to [place] to scan/email to medical records. JG//CMA

## 2016-01-02 IMAGING — CR DG HIP W/ PELVIS BILAT
5 series · 5 of 5 positions shown · non-contrast
Comparison: None.

CLINICAL DATA: Bilateral hip pain

EXAM:
BILATERAL HIP WITH PELVIS - 4+ VIEW

[t pelvis a.p.]
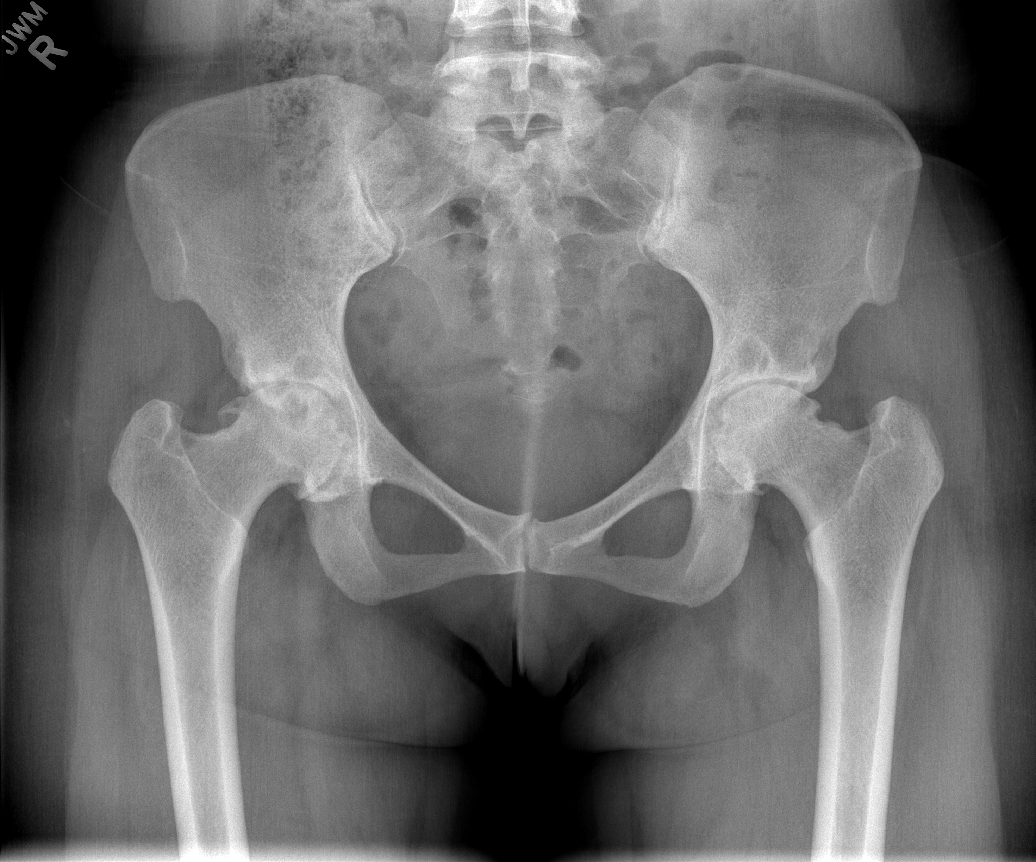

[t hip ap left]
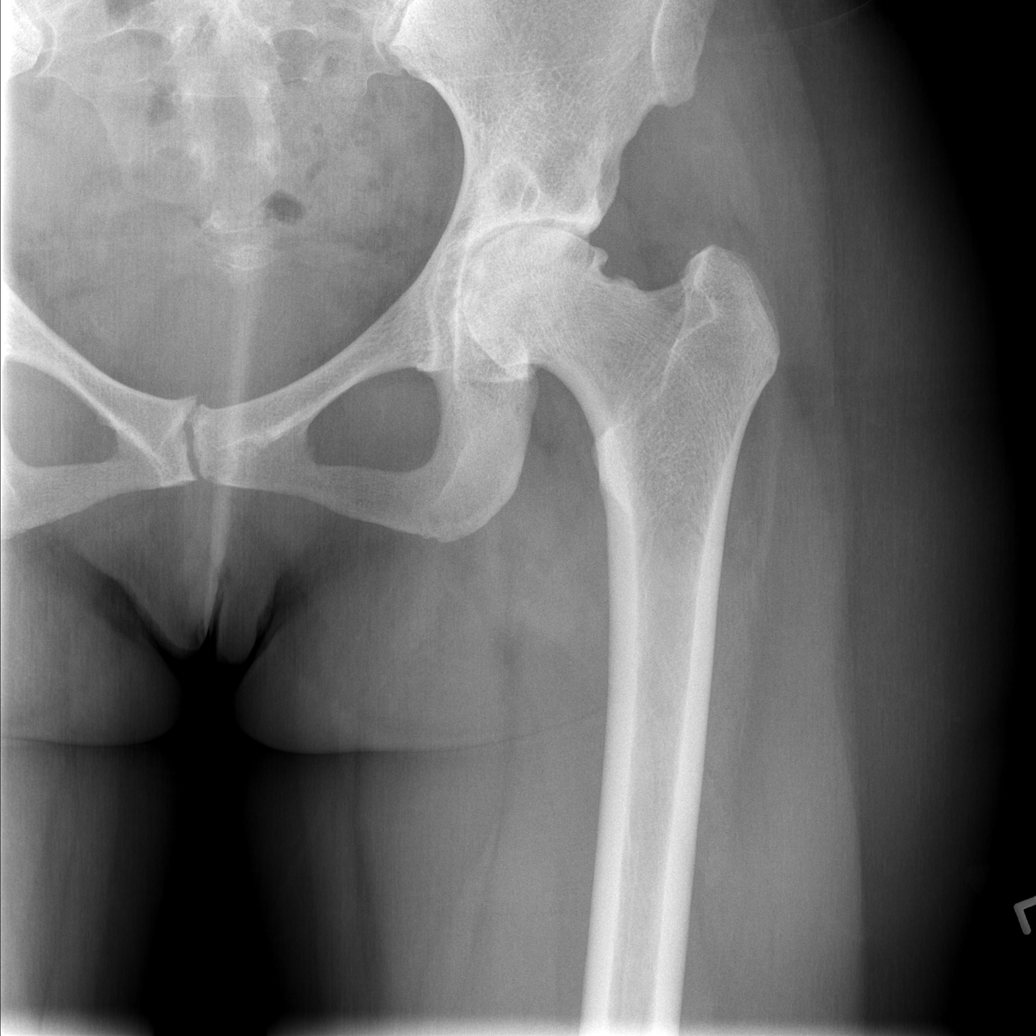

[t hip frog leg left]
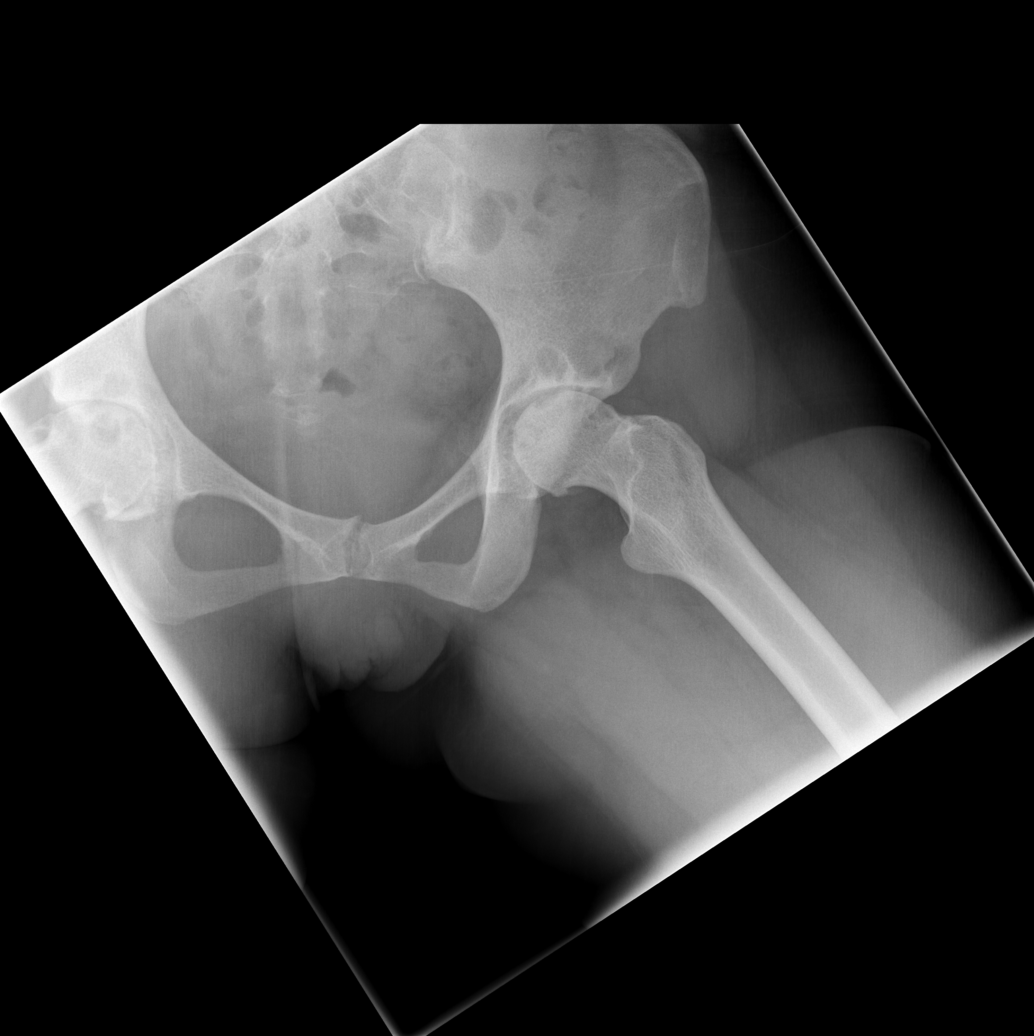

[t hip ap right]
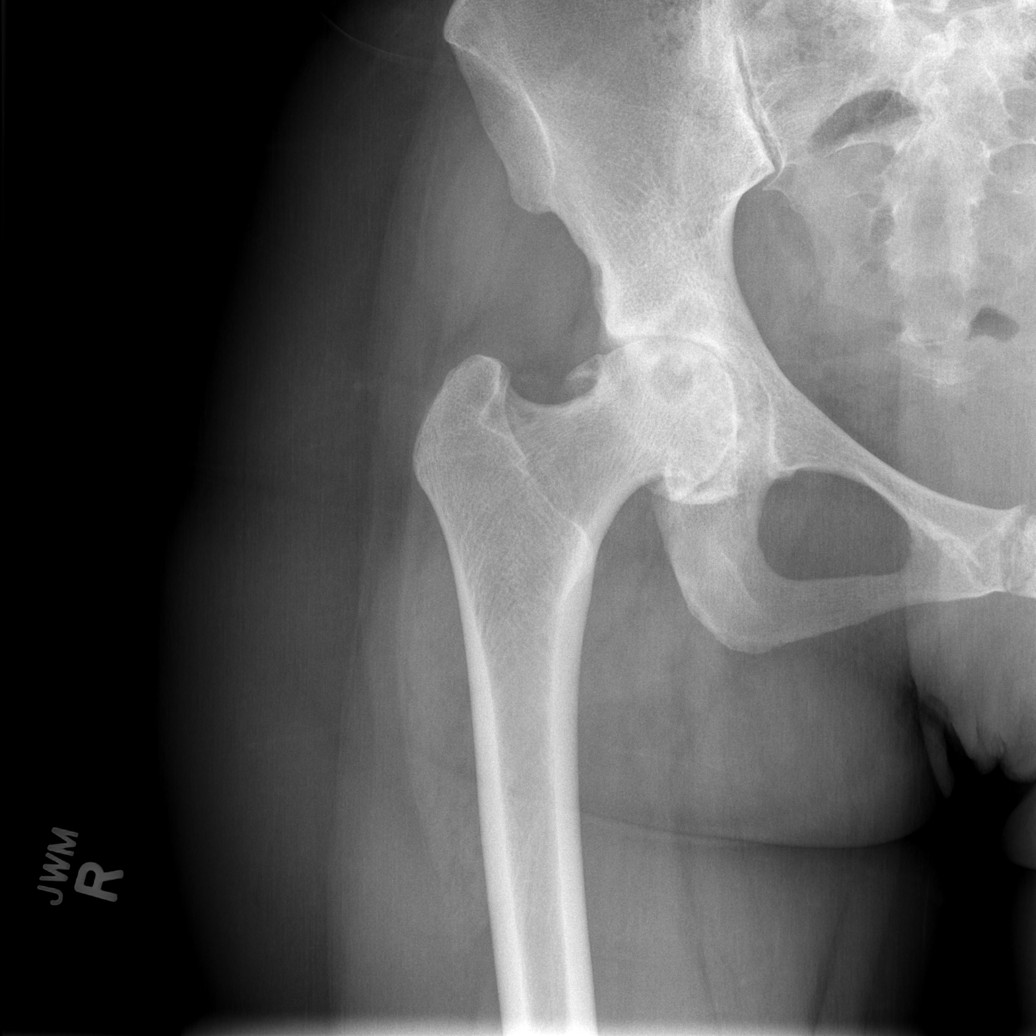

[t hip frog leg right]
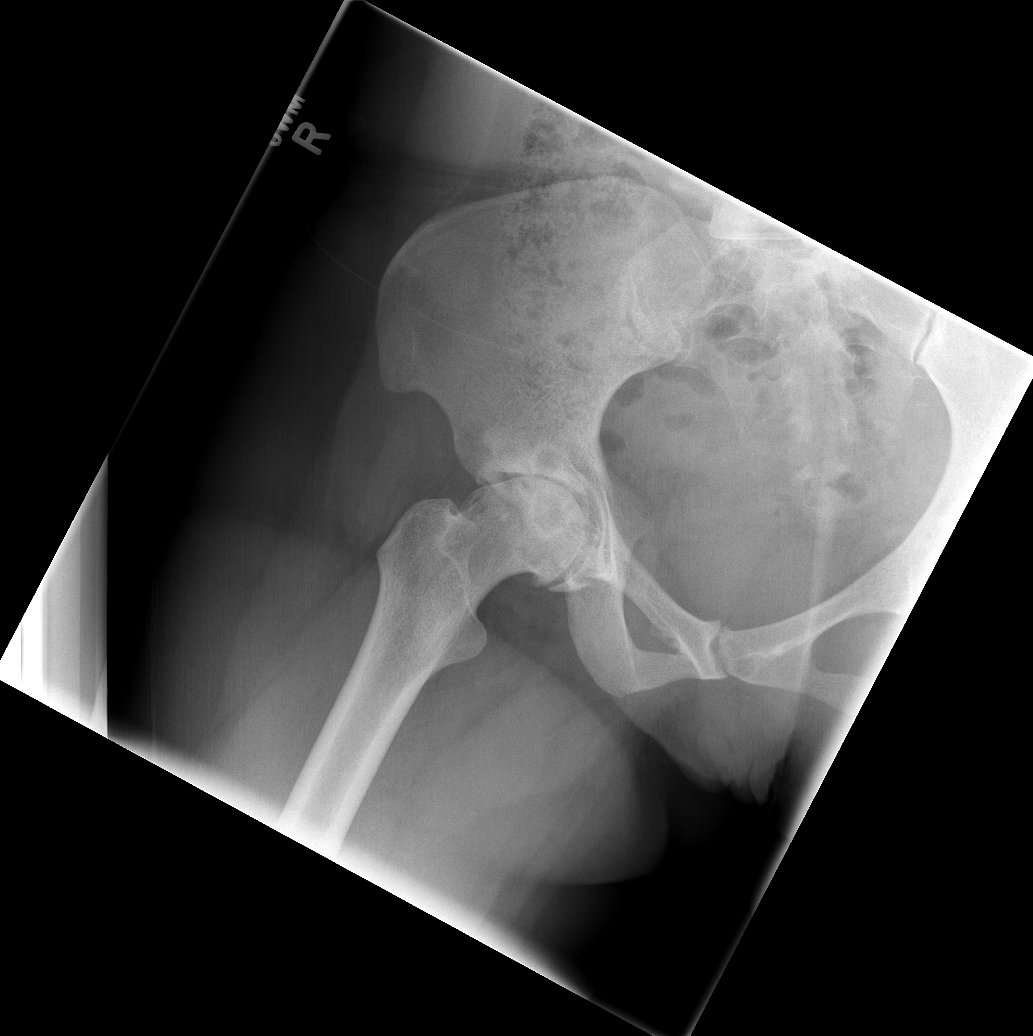

[5 of 5 positions shown; findings below may reference images not displayed]

FINDINGS: Advanced degenerative change in both hip joints with joint space
narrowing, osteophyte formation, and subchondral cyst formation.
Degenerative change more severe on the right than the left.
Underlying AVN is possible. Negative for fracture or mass.
IMPRESSION: Advanced degenerative change in both hips, right greater than left.

## 2016-02-04 ENCOUNTER — Other Ambulatory Visit: Payer: Self-pay | Admitting: Internal Medicine
